# Patient Record
Sex: Male | Born: 1965 | Race: White | Marital: Married | Smoking: Former smoker
Health system: Northeastern US, Academic
[De-identification: ages and names within clinical notes are randomized; demographics above are authoritative.]

## PROBLEM LIST (undated history)

## (undated) DIAGNOSIS — H04129 Dry eye syndrome of unspecified lacrimal gland: Secondary | ICD-10-CM

## (undated) DIAGNOSIS — I10 Essential (primary) hypertension: Secondary | ICD-10-CM

## (undated) DIAGNOSIS — F988 Other specified behavioral and emotional disorders with onset usually occurring in childhood and adolescence: Secondary | ICD-10-CM

## (undated) DIAGNOSIS — F32A Depression, unspecified: Secondary | ICD-10-CM

## (undated) DIAGNOSIS — K635 Polyp of colon: Secondary | ICD-10-CM

## (undated) DIAGNOSIS — F419 Anxiety disorder, unspecified: Secondary | ICD-10-CM

## (undated) DIAGNOSIS — G4730 Sleep apnea, unspecified: Secondary | ICD-10-CM

## (undated) HISTORY — DX: Dry eye syndrome of unspecified lacrimal gland: H04.129

## (undated) HISTORY — DX: Essential (primary) hypertension: I10

## (undated) HISTORY — DX: Other specified behavioral and emotional disorders with onset usually occurring in childhood and adolescence: F98.8

## (undated) HISTORY — DX: Anxiety disorder, unspecified: F41.9

---

## 2011-07-18 ENCOUNTER — Encounter: Payer: Self-pay | Admitting: Gastroenterology

## 2011-07-18 ENCOUNTER — Ambulatory Visit: Payer: Self-pay | Admitting: Primary Care

## 2011-07-18 LAB — LDL CHOLESTEROL, DIRECT: LDL Calculated: 126

## 2011-07-21 ENCOUNTER — Encounter: Payer: Self-pay | Admitting: Primary Care

## 2011-07-21 ENCOUNTER — Ambulatory Visit: Payer: Self-pay | Admitting: Primary Care

## 2011-07-21 VITALS — BP 140/90 | HR 64 | Ht 67.5 in | Wt 232.0 lb

## 2011-07-21 DIAGNOSIS — F32A Depression, unspecified: Secondary | ICD-10-CM | POA: Insufficient documentation

## 2011-07-21 DIAGNOSIS — F419 Anxiety disorder, unspecified: Secondary | ICD-10-CM

## 2011-07-21 MED ORDER — ESCITALOPRAM OXALATE 10 MG PO TABS *I*
10.0000 mg | ORAL_TABLET | Freq: Every day | ORAL | Status: DC
Start: 2011-07-21 — End: 2011-08-08

## 2011-07-21 NOTE — Progress Notes (Signed)
Patient presents today for...  Mood-probably been an issue for years, varying levels. Currently out of work, couple of weeks. So that hasnt' helped. Work, city [employer].  He chose to leave work.  Taken out of work on disability.  Work has been a somewhat toxic environment. Also got married in March, blended family, together for 7. Two kids, live with mom. He's been separated for 10 years, that is OK. She's got 18, 14, 12. Wife reports that he's not doing well with anxiety. Particularly when he was at work.  Some improvement after taking his leave, but still gets worked up when he thinks about returning on the 20th. Next year there will be a different position for him, this year, he wants to go back, feels some obligation to the graduation ceremony, etc, but then again really doesn't want to have to work with this boss ever again.  Wife notices when anxious, will get a temper, short fuse,.  Also some symptoms of depression, lack of motivation, withdrawal, lack of interest in sex, some resignation.   He's actually been treated for anxiety for many years, thinks he's been on medications for 18 years, started on Paxil, then wellbutrin added in 5-6 years. No major adjustments since then.     Some shortness of breath and palpitations. Does seem to be with situational stuff.     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 His two teenagers live with mom      MEDICAL PROBLEMS  Patient Active Problem List   Diagnosis Code   . Anxiety 300.00   . Depression 311       CURRENT ALLERGIES  Review of patient's allergies indicates no known allergies (drug, envir, food or latex).    CURRENT MEDICATIONS  Current Outpatient Prescriptions   Medication Sig   . PARoxetine (PAXIL) 20 MG tablet Take 20 mg by mouth every morning   . buPROPion (WELLBUTRIN XL) 300 MG 24 hr tablet Take 300 mg by mouth every morning   Swallow whole. Do  not crush, break, or chew.   . Multiple Vitamin (MULTIVITAMIN) per tablet Take 1 tablet by mouth daily   . aspirin 81 MG tablet Take 81 mg by mouth daily   . omega-3 acid ethyl esters (LOVAZA) 1 GM capsule Take 2 g by mouth 2 times daily   Swallow whole. Do not crush or chew.     No current facility-administered medications for this visit.       The patients medications and allergies were reviewed, reconciled as needed and no changes were made.    EXAMINATION  Filed Vitals:    07/21/11 1503   BP: 140/90   Pulse: 64   Height: 1.715 m (5' 7.5")   Weight: 105.235 kg (232 lb)     Body mass index is 35.78 kg/(m^2).  Last 4 Weights    07/21/11 1503   Weight: 105.235 kg (232 lb)             IMPRESSION:  Anxiety-with some depression.  With the rage response, not sure the Wellbutrin is the best choice, but since the anxiety is the main issue now, we decided to first make a change in the SSRI to see if we could improve that. Since he's been on meds for so long, don't really want to make a change in too many things at once. He's going to a  marriage counselor starting on Fri, did review that might want to consider individual counselor, he's done that off and on but sounds like there hasn't been an on going counselor for him. We could increase the paxil, but he's not sure that either of these meds have done anything for him, so we decided to make a change. Stop paxil, start lexapro 10mg , have him back in 2-3 weeks to review effect. If we start to get some improvement, then consider backing off on the Wellbutrin.  Might consider psych referral in the future if we're not having luck. Re decreased libido, he just had blood work done, normal except for chol, so we'll wait and see if that changes as mood improves, if not, consider testosterone level check.    Chol-numbers are a little high, mostly with TG. Focus would be weight reduction, defer on medication for now, we'll review that again in the future.     TT with pt and wife 50  min, all counseling regarding diagnosis, management options, medications etc.

## 2011-07-23 ENCOUNTER — Encounter: Payer: Self-pay | Admitting: Primary Care

## 2011-08-08 ENCOUNTER — Encounter: Payer: Self-pay | Admitting: Primary Care

## 2011-08-08 ENCOUNTER — Ambulatory Visit: Payer: Self-pay | Admitting: Primary Care

## 2011-08-08 VITALS — BP 118/70 | HR 62 | Ht 68.0 in | Wt 234.0 lb

## 2011-08-08 MED ORDER — ESCITALOPRAM OXALATE 20 MG PO TABS *I*
20.0000 mg | ORAL_TABLET | Freq: Every day | ORAL | Status: DC
Start: 2011-08-08 — End: 2012-02-06

## 2011-08-08 NOTE — Patient Instructions (Signed)
My vote-increase the lexapro to 20mg , and stop the wellbutrin.

## 2011-08-08 NOTE — Progress Notes (Signed)
Patient presents today for...  Got some body pain after a week to ten days, sore like you first got back to the gym, that cleared after a couple of days. In general, better, but also been off work still. Longer stretches of no blow ups, but patience still short and over reaction. Kicks bikes, etc. Good days are better.  Moody still. Gone to a session with the counselor. Did find it helpful. Making efforts to change districts.     Is noticing some sexual side effects, although they were present on the paxil as well. He's not sure, but might be more a lack of interest/libido thing over an erectile function issue, but there is a component of the later too.     MEDICAL PROBLEMS  Patient Active Problem List   Diagnosis Code   . Anxiety 300.00   . Depression 311       CURRENT ALLERGIES  Review of patient's allergies indicates no known allergies (drug, envir, food or latex).    CURRENT MEDICATIONS  Current Outpatient Prescriptions   Medication Sig   . buPROPion (WELLBUTRIN XL) 300 MG 24 hr tablet Take 300 mg by mouth every morning   Swallow whole. Do not crush, break, or chew.   . Multiple Vitamin (MULTIVITAMIN) per tablet Take 1 tablet by mouth daily   . aspirin 81 MG tablet Take 81 mg by mouth daily   . omega-3 acid ethyl esters (LOVAZA) 1 GM capsule Take 2 g by mouth 2 times daily   Swallow whole. Do not crush or chew.   . escitalopram (LEXAPRO) 10 MG tablet Take 1 tablet (10 mg total) by mouth daily     No current facility-administered medications for this visit.       The patients medications and allergies were reviewed, reconciled as needed and no changes were made.    EXAMINATION  Filed Vitals:    08/08/11 0915   BP: 118/70   Pulse: 62   Height: 1.727 m (5\' 8" )   Weight: 106.142 kg (234 lb)     Body mass index is 35.59 kg/(m^2).  Last 4 Weights    08/08/11 0915   Weight: 106.142 kg (234 lb)             IMPRESSION/PLAN   Anxiety-with some depression, work is a major stressor for him, but now getting into the issue  with too much time out and he's starting to wonder about worth/productivity issues. However, just considering going back seems to panic him still. We have a natural stop to that coming up in July, since he'll start a new position. If we have to, we can wait to send him back until then.  With his rage, still wondering in the wellbutrin might be causing that. I'd like him to stop that and increase the lexapro to 20mg .  We talked about the sexual side effects and if that worsens we can consider a PDI med for a bit. If he improves, he can let me know and we can try him back at work in June, but note written to keep him out until July if we still have lingering issues.   TT 30 min discussion with pt and wife.

## 2011-09-05 ENCOUNTER — Telehealth: Payer: Self-pay | Admitting: Primary Care

## 2011-09-05 NOTE — Telephone Encounter (Signed)
Letter written

## 2011-09-05 NOTE — Telephone Encounter (Signed)
Needs a note to return to work, Will be going back on 09/11/11.    Please put ATTN:  [facility]    [phone] Pt's wife fax #

## 2011-09-05 NOTE — Telephone Encounter (Signed)
Letter faxed.

## 2011-09-08 ENCOUNTER — Telehealth: Payer: Self-pay | Admitting: Primary Care

## 2011-09-08 NOTE — Telephone Encounter (Signed)
Letter faxed.

## 2011-09-08 NOTE — Telephone Encounter (Signed)
I did this, it may have been sent already, but I'll reprint it.

## 2011-09-08 NOTE — Telephone Encounter (Signed)
Patient need [employer] letter stating its ok for him to return to work on 09-11-11 and the fax to [phone]

## 2011-09-17 ENCOUNTER — Ambulatory Visit: Payer: Self-pay | Admitting: Physician Assistant

## 2011-09-17 ENCOUNTER — Encounter: Payer: Self-pay | Admitting: Physician Assistant

## 2011-09-17 VITALS — BP 136/90 | HR 76 | Temp 98.5°F | Ht 67.25 in | Wt 233.0 lb

## 2011-09-17 DIAGNOSIS — R070 Pain in throat: Secondary | ICD-10-CM

## 2011-09-17 DIAGNOSIS — J069 Acute upper respiratory infection, unspecified: Secondary | ICD-10-CM

## 2011-09-17 MED ORDER — FLUTICASONE PROPIONATE 50 MCG/ACT NA SUSP *I*
2.0000 | Freq: Every day | NASAL | Status: DC
Start: 2011-09-17 — End: 2012-05-18

## 2011-09-17 MED ORDER — ALBUTEROL SULFATE HFA 108 (90 BASE) MCG/ACT IN AERS *I*
1.0000 | INHALATION_SPRAY | Freq: Four times a day (QID) | RESPIRATORY_TRACT | Status: DC | PRN
Start: 2011-09-17 — End: 2012-05-18

## 2011-09-17 MED ORDER — CEFDINIR 300 MG PO CAPS *I*
300.0000 mg | ORAL_CAPSULE | Freq: Two times a day (BID) | ORAL | Status: AC
Start: 2011-09-17 — End: 2011-09-27

## 2011-09-17 NOTE — Progress Notes (Signed)
Patient is a 46 year old male reports yachts today with chief complaint of upper respiratory symptoms for 4 days.  I reports of cough, scratchy throat, postnasal drip.  Denies any fevers or chills.  He has been using Advil and over-the-counter cold medication.  He does smoke cigars 1-2 a day    Patient sitting in chair in no acute distress  HEEN,Nose: turbinates clear no discharge, Tongue: midline, tonsills no exudate, nonerythematous, Ears: the external canal clear tempanic membrane visible noninjected bilaterally  Face: Nontender frontal or maxillary sinuses  Lymphatics no lymphadenopathy in anterior or posterior cervical chain, submental or submandibular region, pre-or post auricular region  Lungs: Clear to auscultation no wheezes rhonchi or rails.  Cardiac: Regular rate and rhythm no murmurs gallops or rubs.      Assessment and plan:  #1 URI: Patient may have viral upper respiratory earlier bronchitis.  His lungs are clear auscultation of ecchymosis symptoms are due to postnasal drip I advised Flonase and Claritin.  For his cough I gave him albuterol.  I did call her prescription for omnicef advised him to fill this in 2 days if symptoms have not resolved  #2 throat pain rapid strep was negative throat culture was sent

## 2011-09-17 NOTE — Patient Instructions (Signed)
claritin 10 mg once a day for 1 week

## 2011-09-19 LAB — STREP A CULTURE, THROAT

## 2011-09-22 ENCOUNTER — Telehealth: Payer: Self-pay | Admitting: Physician Assistant

## 2011-09-22 ENCOUNTER — Telehealth: Payer: Self-pay | Admitting: Primary Care

## 2011-09-22 NOTE — Telephone Encounter (Signed)
Message copied by Duane Lope on Mon Sep 22, 2011 10:25 AM  ------       Message from: Christene Lye       Created: Mon Sep 22, 2011  9:25 AM         Please call pt and inform him he has strep and to start the omnicef i gave him  ------

## 2011-09-22 NOTE — Telephone Encounter (Signed)
Attempted all, at [phone], unable to reach patient, work number we need an extension, all a recording,  "other" number not working and home # is incorrect.

## 2011-09-22 NOTE — Telephone Encounter (Addendum)
Attempted all, at [phone], unable to reach patient, work number we need an extension, all a recording, "other" number not working and home # is incorrect.          Duane Lope Nicki [place]) A at 09/22/2011 10:25 AM     Status: Signed                Message copied by Duane Lope on Mon Sep 22, 2011 10:25 AM   ------   Message from: Christene Lye   Created: Mon Sep 22, 2011 9:25 AM   Please call pt and inform him he has strep and to start the [facility] i gave him   ------              Per above note  I called patient's work number I spoke with Diplomatic Services operational officer in [employer]. I asked her to please contact patient and have him call Dr.Vaules office  I did not give her any other information  I discussed with Dr. Alinda Sierras, we were able to contact his wife who is on his [employer] form.  She stated his cell phone number is [phone]        Patient did call back later in the day I did inform him to start antibiotic

## 2011-09-23 NOTE — Telephone Encounter (Signed)
Spoke with patient wife, patient was contacted yesterday by joe. Patient aware.Marland KitchenMarland Kitchen

## 2011-12-22 ENCOUNTER — Ambulatory Visit: Payer: Self-pay | Admitting: Primary Care

## 2011-12-22 ENCOUNTER — Encounter: Payer: Self-pay | Admitting: Primary Care

## 2011-12-22 VITALS — BP 120/84 | HR 76 | Ht 67.0 in | Wt 238.0 lb

## 2011-12-22 DIAGNOSIS — R41840 Attention and concentration deficit: Secondary | ICD-10-CM

## 2011-12-22 DIAGNOSIS — F419 Anxiety disorder, unspecified: Secondary | ICD-10-CM

## 2011-12-22 MED ORDER — ATOMOXETINE HCL 40 MG PO CAPS *I*
40.0000 mg | ORAL_CAPSULE | Freq: Two times a day (BID) | ORAL | Status: DC
Start: 2011-12-22 — End: 2012-04-01

## 2011-12-22 NOTE — Patient Instructions (Signed)
What we will try is adding in a different attention med called straterra. That way we can leave the lexapro where it is and see if we can address the new attention/fidgety symptom with the new drug. If that doesn't work, then we'll probably have to drop the lexapro and try something else. The new medicine will be taken without changing the lexapro dose. Start with one pill a day, then after 3 days go to twice a day. Hopefully, will notice some effect in around a week, but can take up to 4 weeks to reach a full effect.

## 2011-12-22 NOTE — Progress Notes (Signed)
Patient presents today for...  Anxiety-we switched him off Paxil and Wellbutrin and start him on Lexapro.  He noticed his generalized anxiety seems to be much less.  Overall feels better.  Next he feels is a slight difference in for now.  Feels a little but more antsy at times and having some difficulty with focus.  Difficulty concentrating on tasks at work sometimes.  Also feels that he needs to get certain locations when he's driving in a car when he was in a concert felt the need to get away from people.  Its a new symptom for him he does recall having a component of his anxiety before.  Actually had a friend who had some medication for attention deficit and he did try focalin 40mg , helped some, but not totally, still had the irritability and the feeling of being fidgety.  Only a think she's noted since starting the Lexapro is that his weight is up.  He doesn't recall having any issues with attention in his youth.    MEDICAL PROBLEMS  Patient Active Problem List   Diagnosis Code   . Anxiety 300.00   . Depression 311       CURRENT ALLERGIES  Review of patient's allergies indicates no known allergies (drug, envir, food or latex).    CURRENT MEDICATIONS  Current Outpatient Prescriptions   Medication Sig   . escitalopram (LEXAPRO) 20 MG tablet Take 1 tablet (20 mg total) by mouth daily   . Multiple Vitamin (MULTIVITAMIN) per tablet Take 1 tablet by mouth daily   . aspirin 81 MG tablet Take 81 mg by mouth daily   . omega-3 acid ethyl esters (LOVAZA) 1 GM capsule Take 2 g by mouth 2 times daily   Swallow whole. Do not crush or chew.   . fluticasone (FLONASE) 50 MCG/ACT nasal spray 2 sprays by Nasal route daily   . albuterol (PROVENTIL, VENTOLIN, PROAIR HFA) 108 (90 BASE) MCG/ACT inhaler Inhale 1-2 puffs into the lungs every 6 hours as needed for Wheezing   Shake well before each use.     No current facility-administered medications for this visit.       The patients medications and allergies were reviewed, reconciled  as needed and no changes were made.    EXAMINATION  Filed Vitals:    12/22/11 1600   BP: 120/84   Pulse: 76   Height: 1.702 m (5\' 7" )   Weight: 107.956 kg (238 lb)     Body mass index is 37.27 kg/(m^2).  Last 4 Weights    12/22/11 1600   Weight: 107.956 kg (238 lb)             IMPRESSION/PLAN   Anxiety-current symptom is kind of a mix of an attention deficit almost a mild hyperactivity type symptoms.  A little unusual in adults.  May just be a component of treating his anxiety with the newer medication and a dressing different symptoms.  It's possible this since he was there before and was treated by the Wellbutrin Paxil and unmasked that.  We discussed the options today which would be changing medication yet again to a different pill for the anxiety or perhaps trying a different form of a stimulant since he failed the Focalin.  We elected to try the latter first and started on Strattera 40 mg daily for 3 days and increasing to twice a day.  We'll see how much that helps with focus attention in many tendency for the hyperactivity.  He doesn't cause  full resolution of symptoms might have to consider stopping the Lexapro and starting a new agent.    Total time spent with patient 25 minutes all counseling

## 2011-12-31 ENCOUNTER — Telehealth: Payer: Self-pay | Admitting: Primary Care

## 2011-12-31 NOTE — Telephone Encounter (Signed)
The medication prescribe on his office visit 12/22/11 Strattera 40 mg take one capsule by mouth twice a day requires a Prior Authorization, I called [facility] per patient insurance the only way they will cover Strattera if he has tried two long acting generic drugs. Which would be medications like  Generics:  Concerta ER, Adderall XR, Ritalin LA. Focalin will not be covered.       Spoke with patient wife, she is aware of this info she is ok with waiting until dr returns: do not fell comfortable with another provider changing her husband medication.

## 2012-01-02 MED ORDER — METHYLPHENIDATE HCL 40 MG PO CP24 *A*
40.0000 mg | ORAL_CAPSULE | Freq: Every day | ORAL | Status: DC
Start: 2012-01-02 — End: 2012-01-22

## 2012-01-02 NOTE — Telephone Encounter (Signed)
Spoke with patient spouse..... She is aware.

## 2012-01-02 NOTE — Telephone Encounter (Signed)
We'll try the ritalin LA at 40mg , script printed.

## 2012-01-22 ENCOUNTER — Other Ambulatory Visit: Payer: Self-pay | Admitting: Primary Care

## 2012-01-22 MED ORDER — METHYLPHENIDATE HCL 40 MG PO CP24 *A*
40.0000 mg | ORAL_CAPSULE | Freq: Every day | ORAL | Status: DC
Start: 2012-01-22 — End: 2012-02-27

## 2012-02-06 ENCOUNTER — Other Ambulatory Visit: Payer: Self-pay | Admitting: Primary Care

## 2012-02-17 ENCOUNTER — Other Ambulatory Visit: Payer: Self-pay | Admitting: Primary Care

## 2012-02-27 ENCOUNTER — Other Ambulatory Visit: Payer: Self-pay | Admitting: Primary Care

## 2012-02-27 MED ORDER — METHYLPHENIDATE HCL 40 MG PO CP24 *A*
40.0000 mg | ORAL_CAPSULE | Freq: Every day | ORAL | Status: DC
Start: 2012-02-27 — End: 2012-04-01

## 2012-02-27 NOTE — Telephone Encounter (Signed)
rx up front for pick up 

## 2012-02-27 NOTE — Telephone Encounter (Signed)
I stop checked and documented

## 2012-04-01 ENCOUNTER — Ambulatory Visit: Payer: Self-pay | Admitting: Primary Care

## 2012-04-01 ENCOUNTER — Encounter: Payer: Self-pay | Admitting: Primary Care

## 2012-04-01 VITALS — BP 120/80 | HR 64 | Ht 68.0 in | Wt 239.0 lb

## 2012-04-01 DIAGNOSIS — R42 Dizziness and giddiness: Secondary | ICD-10-CM

## 2012-04-01 MED ORDER — METHYLPHENIDATE HCL 40 MG PO CP24 *A*
40.0000 mg | ORAL_CAPSULE | Freq: Every day | ORAL | Status: DC
Start: 2012-04-01 — End: 2012-04-01

## 2012-04-01 MED ORDER — ESCITALOPRAM OXALATE 20 MG PO TABS *I*
20.0000 mg | ORAL_TABLET | Freq: Every day | ORAL | Status: DC
Start: 2012-04-01 — End: 2015-11-07

## 2012-04-01 MED ORDER — MECLIZINE HCL 25 MG PO TABS *I*
25.0000 mg | ORAL_TABLET | Freq: Three times a day (TID) | ORAL | Status: DC | PRN
Start: 2012-04-01 — End: 2012-05-18

## 2012-04-01 MED ORDER — METHYLPHENIDATE HCL 40 MG PO CP24 *A*
ORAL_CAPSULE | ORAL | Status: DC
Start: 2012-04-01 — End: 2012-04-05

## 2012-04-01 NOTE — Progress Notes (Signed)
Patient presents today for...  Dizziness-past week or so, having both a spinning feeling and a lightheaded feeling, a feeling of fogginess. There off and on, at its peak, would need to go lay down. Was trying some dramamine, helped a little. Feels unsteady after getting up off the bed. Laying on the couch, noticed a feeling like he's falling. Maybe a little queasy, but not much. No fevers, no chills, a little stuffy. Seems worse during the cold snap, felt better when the temperature went up to 40.  Had something like this before, but its been years.      ADD-seems to be doing better, that change happened months ago.     MEDICAL PROBLEMS  Patient Active Problem List   Diagnosis Code   . Anxiety 300.00   . Depression 311       CURRENT ALLERGIES  Review of patient's allergies indicates no known allergies (drug, envir, food or latex).    CURRENT MEDICATIONS  Current Outpatient Prescriptions   Medication Sig   . methylphenidate (RITALIN LA) 40 MG 24 hr capsule Take 1 capsule (40 mg total) by mouth daily (before breakfast)   MDD 40 mg   . escitalopram (LEXAPRO) 20 MG tablet TAKE 1 TABLET BY MOUTH EVERY DAY   . atomoxetine (STRATTERA) 40 MG capsule Take 1 capsule (40 mg total) by mouth 2 times daily   . fluticasone (FLONASE) 50 MCG/ACT nasal spray 2 sprays by Nasal route daily   . albuterol (PROVENTIL, VENTOLIN, PROAIR HFA) 108 (90 BASE) MCG/ACT inhaler Inhale 1-2 puffs into the lungs every 6 hours as needed for Wheezing   Shake well before each use.   . Multiple Vitamin (MULTIVITAMIN) per tablet Take 1 tablet by mouth daily   . aspirin 81 MG tablet Take 81 mg by mouth daily   . omega-3 acid ethyl esters (LOVAZA) 1 GM capsule Take 2 g by mouth 2 times daily   Swallow whole. Do not crush or chew.     No current facility-administered medications for this visit.       The patients medications and allergies were reviewed, reconciled as needed and no changes were made.    EXAMINATION  Filed Vitals:    04/01/12 0818   BP: 120/80    Pulse: 64   Height: 1.727 m (5\' 8" )   Weight: 108.41 kg (239 lb)     Body mass index is 36.35 kg/(m^2).  Last 4 Weights    04/01/12 0818   Weight: 108.41 kg (239 lb)     Carotids are without bruits  Thyroid is nl, no nodules  Ears are clear. EOMI, facial muscles move symmetrically, finger to nose done normally. Rhomberg negative, gait and speech are nl.   Lungs are clear, no wheeze, no rales  Card exam shows a rrr, nl S1, S2, no murmors  Ext show no edema. Good DP pulses.  Hallpike maneuver back to the left reproduces dizziness, but no nystagmus.     IMPRESSION/PLAN   Dizziness-seems consistent with peripheral source, no findings to implicate central process. We'll try him on some meclizine and give this some time.     ADD-changes script to three month supply.

## 2012-04-05 ENCOUNTER — Telehealth: Payer: Self-pay | Admitting: Primary Care

## 2012-04-05 MED ORDER — METHYLPHENIDATE HCL 40 MG PO CP24 *A*
40.0000 mg | ORAL_CAPSULE | Freq: Every day | ORAL | Status: DC
Start: 2012-04-05 — End: 2012-05-13

## 2012-04-05 NOTE — Telephone Encounter (Signed)
rx mailed to pharmacy

## 2012-04-05 NOTE — Telephone Encounter (Signed)
pharmacist called back with request from the pt to have the rx mailed to the pharmacy when it is complete.  [facility]

## 2012-04-05 NOTE — Telephone Encounter (Signed)
Received rx for methylphenidate with no directions.  Please  Reprint with directions & pt will come back to pick up.

## 2012-05-13 ENCOUNTER — Other Ambulatory Visit: Payer: Self-pay | Admitting: Primary Care

## 2012-05-13 MED ORDER — METHYLPHENIDATE HCL 40 MG PO CP24 *A*
40.0000 mg | ORAL_CAPSULE | Freq: Every day | ORAL | Status: DC
Start: 2012-05-13 — End: 2012-07-14

## 2012-05-13 NOTE — Telephone Encounter (Signed)
I stop checked and documented

## 2012-05-14 NOTE — Telephone Encounter (Signed)
rx up front for pick up 

## 2012-05-18 ENCOUNTER — Encounter: Payer: Self-pay | Admitting: Primary Care

## 2012-05-18 ENCOUNTER — Ambulatory Visit: Payer: Self-pay | Admitting: Primary Care

## 2012-05-18 VITALS — BP 120/92 | HR 74 | Temp 98.8°F | Ht 68.0 in | Wt 237.0 lb

## 2012-05-18 DIAGNOSIS — J40 Bronchitis, not specified as acute or chronic: Secondary | ICD-10-CM

## 2012-05-18 MED ORDER — ALBUTEROL SULFATE HFA 108 (90 BASE) MCG/ACT IN AERS *I*
1.0000 | INHALATION_SPRAY | Freq: Four times a day (QID) | RESPIRATORY_TRACT | Status: DC | PRN
Start: 2012-05-18 — End: 2014-01-04

## 2012-05-18 NOTE — Progress Notes (Signed)
Patient presents today for...  Bronchitis-started with his wife, she had an URI not too long ago, he typically will get things from her, there was a week in between, but the last 4-5 days, started to get that. Coughing a lot, was wheezing last night, couldn't lay flat. Malaise, stuffy nose, no fever. Cough is dry. Took a couple of nyquil last night, not sure if it helped. No pain with breathing.     Cutting back on the cigars, trying to remove triggers, drinking lots of tea. Maybe smoking 1 every 2-2.5 days.     MEDICAL PROBLEMS  Patient Active Problem List   Diagnosis Code   . Anxiety 300.00   . Depression 311       CURRENT ALLERGIES  Review of patient's allergies indicates no known allergies (drug, envir, food or latex).    CURRENT MEDICATIONS  Current Outpatient Prescriptions   Medication Sig   . methylphenidate (RITALIN LA) 40 MG 24 hr capsule Take 1 capsule (40 mg total) by mouth daily (before breakfast)   MDD 40 mg  Code B   . escitalopram (LEXAPRO) 20 MG tablet Take 1 tablet (20 mg total) by mouth daily   . Multiple Vitamin (MULTIVITAMIN) per tablet Take 1 tablet by mouth daily   . aspirin 81 MG tablet Take 81 mg by mouth daily   . omega-3 acid ethyl esters (LOVAZA) 1 GM capsule Take 2 g by mouth 2 times daily   Swallow whole. Do not crush or chew.     No current facility-administered medications for this visit.       The patients medications and allergies were reviewed, reconciled as needed and no changes were made.    EXAMINATION  Filed Vitals:    05/18/12 1101   BP: 120/92   Pulse: 74   Temp: 37.1 C (98.8 F)   TempSrc: Tympanic   Height: 1.727 m (5\' 8" )   Weight: 107.502 kg (237 lb)   SpO2: 99%     Body mass index is 36.04 kg/(m^2).  Last 4 Weights    05/18/12 1101   Weight: 107.502 kg (237 lb)     Carotids are without bruits  Thyroid is nl, no nodules  Lungs are clear, no wheeze, no rales, he might have a very slight wheeze with forced exhalation.   Card exam shows a rrr, nl S1, S2, no murmors  Ext show  no edema. Good DP pulses.    IMPRESSION/PLAN   Bronchitis-reviewed that most of these are viral, we'll treat the wheeze with some albuterol, instructed on use and reviewed possible side effects.  If things persist beyond the two weeks or if he gets pleuritic pain, fever or SOB worsening, he will call.

## 2012-07-14 ENCOUNTER — Other Ambulatory Visit: Payer: Self-pay | Admitting: Primary Care

## 2012-07-14 NOTE — Telephone Encounter (Signed)
istop checked

## 2012-07-15 MED ORDER — METHYLPHENIDATE HCL 40 MG PO CP24 *A*
40.0000 mg | ORAL_CAPSULE | Freq: Every day | ORAL | Status: DC
Start: 2012-07-14 — End: 2012-10-07

## 2012-07-16 ENCOUNTER — Ambulatory Visit: Payer: Self-pay | Admitting: Primary Care

## 2012-07-16 ENCOUNTER — Encounter: Payer: Self-pay | Admitting: Primary Care

## 2012-07-16 VITALS — BP 120/80 | HR 66 | Ht 68.0 in | Wt 236.0 lb

## 2012-07-16 DIAGNOSIS — L02211 Cutaneous abscess of abdominal wall: Secondary | ICD-10-CM

## 2012-07-16 MED ORDER — CEPHALEXIN 500 MG PO CAPS *I*
500.0000 mg | ORAL_CAPSULE | Freq: Three times a day (TID) | ORAL | Status: DC
Start: 2012-07-16 — End: 2013-05-04

## 2012-07-16 NOTE — Progress Notes (Signed)
Patient presents today for...  Rash-noticed it on Tue, 4 days ago. He was at work, felt some irritation in the groin, later in the day, went to the bathroom and noticed a rash.  Seemed to get worse with some manipulation. He mentioned to his wife, she had a yeast infection and she treated that and is fine.   Rash is a swelling on one side, and on the side of the leg a little sensitive.     MEDICAL PROBLEMS  Patient Active Problem List   Diagnosis Code   . Anxiety 300.00   . Depression 311       CURRENT ALLERGIES  Review of patient's allergies indicates no known allergies (drug, envir, food or latex).    CURRENT MEDICATIONS  Current Outpatient Prescriptions   Medication Sig   . methylphenidate (RITALIN LA) 40 MG 24 hr capsule Take 1 capsule (40 mg total) by mouth daily (before breakfast)   MDD 40 mg  Code B   . albuterol (PROVENTIL, VENTOLIN, PROAIR HFA) 108 (90 BASE) MCG/ACT inhaler Inhale 1-2 puffs into the lungs every 6 hours as needed for Wheezing   Shake well before each use.   . escitalopram (LEXAPRO) 20 MG tablet Take 1 tablet (20 mg total) by mouth daily   . Multiple Vitamin (MULTIVITAMIN) per tablet Take 1 tablet by mouth daily   . aspirin 81 MG tablet Take 81 mg by mouth daily   . omega-3 acid ethyl esters (LOVAZA) 1 GM capsule Take 2 g by mouth 2 times daily   Swallow whole. Do not crush or chew.     No current facility-administered medications for this visit.       The patients medications and allergies were reviewed, reconciled as needed and no changes were made.    EXAMINATION  Filed Vitals:    07/16/12 1541   BP: 120/80   Pulse: 66   Height: 1.727 m (5\' 8" )   Weight: 107.049 kg (236 lb)     Body mass index is 35.89 kg/(m^2).  Last 4 Weights    07/16/12 1541   Weight: 107.049 kg (236 lb)     Really not a diffuse rash, more of a cyst, seems a bit higher than the LN. The raised area is around 0.5cm, pretty firm, not fluctuant. It is tender to touch. There is a scab on the surface but he states that is  from where he scratched it.         IMPRESSION/PLAN   Cyst-seems more consistent with a sebaceous cyst than a rash or yeast process. We not reddened, but it tender. We will start him on some antibiotics, if it doesn't. Improve-might ask derm to take a look. I'd doubt a malignancy with how quickly it arose, but I can't get a definite explanation for this.  Not really a pustule like a herpetic lesion, not ulcerating like some of the STDs would in that area, not varigated or warty, surface smooth and he denies any risky behavior or new partners.

## 2012-08-13 ENCOUNTER — Telehealth: Payer: Self-pay | Admitting: Primary Care

## 2012-08-13 ENCOUNTER — Ambulatory Visit: Payer: Self-pay | Admitting: Primary Care

## 2012-08-13 NOTE — Telephone Encounter (Signed)
Pt had an apt. This afternoon , he canceled because he could not get out of work will call back on Monday if he is still not feeling well.

## 2012-10-07 ENCOUNTER — Other Ambulatory Visit: Payer: Self-pay | Admitting: Primary Care

## 2012-10-07 MED ORDER — METHYLPHENIDATE HCL 40 MG PO CP24 *A*
40.0000 mg | ORAL_CAPSULE | Freq: Every day | ORAL | Status: DC
Start: 2012-10-07 — End: 2013-01-10

## 2012-10-07 NOTE — Telephone Encounter (Signed)
Wife advised prescription be ready for pick up on Monday.

## 2012-10-07 NOTE — Telephone Encounter (Signed)
Wife advised. 

## 2012-10-08 NOTE — Telephone Encounter (Signed)
Prescription up front.

## 2013-01-10 ENCOUNTER — Other Ambulatory Visit: Payer: Self-pay | Admitting: Primary Care

## 2013-01-10 MED ORDER — METHYLPHENIDATE HCL 40 MG PO CP24 *A*
40.0000 mg | ORAL_CAPSULE | Freq: Every day | ORAL | Status: DC
Start: 2013-01-10 — End: 2013-04-05

## 2013-01-10 NOTE — Telephone Encounter (Signed)
Istop checked

## 2013-01-10 NOTE — Telephone Encounter (Signed)
RX dated 01/10/2013 upfront for pickup

## 2013-01-28 ENCOUNTER — Encounter: Payer: Self-pay | Admitting: Gastroenterology

## 2013-02-14 ENCOUNTER — Other Ambulatory Visit: Payer: Self-pay | Admitting: Primary Care

## 2013-03-07 ENCOUNTER — Ambulatory Visit: Payer: Self-pay | Admitting: Primary Care

## 2013-04-05 ENCOUNTER — Other Ambulatory Visit: Payer: Self-pay | Admitting: Primary Care

## 2013-04-05 ENCOUNTER — Ambulatory Visit: Payer: Self-pay | Admitting: Primary Care

## 2013-04-05 MED ORDER — METHYLPHENIDATE HCL 40 MG PO CP24 *A*
40.0000 mg | ORAL_CAPSULE | Freq: Every day | ORAL | Status: DC
Start: 2013-04-05 — End: 2013-07-12

## 2013-04-05 NOTE — Telephone Encounter (Signed)
istop checked

## 2013-04-05 NOTE — Telephone Encounter (Signed)
Script dated April 05, 2013 up front for pick up.

## 2013-05-02 ENCOUNTER — Ambulatory Visit: Payer: Self-pay | Admitting: Primary Care

## 2013-05-04 ENCOUNTER — Encounter: Payer: Self-pay | Admitting: Primary Care

## 2013-05-04 ENCOUNTER — Ambulatory Visit: Payer: Self-pay | Admitting: Primary Care

## 2013-05-04 VITALS — BP 130/85 | HR 68 | Ht 68.0 in | Wt 241.0 lb

## 2013-05-04 DIAGNOSIS — F988 Other specified behavioral and emotional disorders with onset usually occurring in childhood and adolescence: Secondary | ICD-10-CM | POA: Insufficient documentation

## 2013-05-04 NOTE — Progress Notes (Signed)
Patient presents today for...  Weight-weight was up to 250, hadn't been going consistently to the gym, carried over from the summer, more recently getting there 3-4 days a week. He is wondering if the med (lexapro) is related.  Breakfast and lunch tend to be healthy, dinner with the family, for him he thinks its portion control. Working on that.     Anxiety-he feels he's doing well and worth taking the med.    ADD-he's not as sure with the ritalin, attention has been better.     Smoking-quit for 6 weeks, in the fall, noticed some weight gain, so he attributed that to quitting, so got back on.  Not heavy, would like to stop.  Successful with the gum.     ROS   Dt 17. Son 15.     MEDICAL PROBLEMS  Patient Active Problem List   Diagnosis Code    Anxiety 300.00    Depression 311    ADD (attention deficit disorder) 314.00       CURRENT ALLERGIES  Review of patient's allergies indicates no known allergies (drug, envir, food or latex).    CURRENT MEDICATIONS  Current Outpatient Prescriptions   Medication Sig    methylphenidate (RITALIN LA) 40 MG 24 hr capsule Take 1 capsule (40 mg total) by mouth daily (before breakfast)   Max daily dose: 40 mg MDD 40 mg  Code B    escitalopram (LEXAPRO) 20 MG tablet Take 1 tablet (20 mg total) by mouth daily    Multiple Vitamin (MULTIVITAMIN) per tablet Take 1 tablet by mouth daily    albuterol (PROVENTIL, VENTOLIN, PROAIR HFA) 108 (90 BASE) MCG/ACT inhaler Inhale 1-2 puffs into the lungs every 6 hours as needed for Wheezing   Shake well before each use.     No current facility-administered medications for this visit.       The patients medications and allergies were reviewed, reconciled as needed and no changes were made.    EXAMINATION  Filed Vitals:    05/04/13 1434   BP: 130/85   Pulse: 68   Height: 1.727 m (5\' 8" )   Weight: 109.317 kg (241 lb)     Body mass index is 36.65 kg/(m^2).  Last 4 Weights    05/04/13 1434   Weight: 109.317 kg (241 lb)     PE;  HEENT-PER, EOMI, OP  clear, no tongue lesions, TMs nl,   Thyroid- without nodules or goiter.  LN neg at neck, supraclavicular area, axilla and groin.  Carotids no bruits.  Lungs resonant on percussion, clear on auscultation, no wheeze, no rales no rub.  Card exam shows  RRR with no mrumors, nl S1, S2, no S3, S4. PMI nondisplaced. Pulses symmetric at wrists and ankles, no abd bruits.  Abd shows no scars, normal BS, No HSM, no palpable masses and no tenderness.  MSK shows good ROM of wrists, elbows, shoulders, hips, knees and ankles, with no synovitis.  Skin-no lesions concerning for malignancy, no rashes.  GU-no testicular masses, no hernias.   Neuro-symmetric reflexes at wrists, elbows, knees and ankles, normal gait, facial muscles symmetric, normal strength in all extremities, speech was nl,   Psych-nl affect, no visible tension/anxiety. Good eye contact        IMPRESSION/PLAN   1. Add-continue with current dose of ritalin.  2. Anxiety-he feels med is helpful, no major side effects so will continue.   3. Tobacco-discussed, reviewed smoking cessation suggestions. He will pick a new quit date and stop.  Reviewed safety issues, re seatbelts, helmets, trauma prevention.  Alcohol use reviewed, recommendations discusssed  Aware of STD prevention strategies  Colonoscopy recommendations for colon cancer screening reviewed.   Reviewed skin cancer risk/prevention.  Reviewed self testicular examination.  Reviewed current issues and debate with prostate cancer screening.   Diet and exercise recommendations reviewed.  Immunizations brought up to date.   HCP reviewed-blank supplied

## 2013-07-12 ENCOUNTER — Other Ambulatory Visit: Payer: Self-pay | Admitting: Primary Care

## 2013-07-12 MED ORDER — METHYLPHENIDATE HCL 40 MG PO CP24 *A*
40.0000 mg | ORAL_CAPSULE | Freq: Every day | ORAL | Status: DC
Start: 2013-07-12 — End: 2013-10-10

## 2013-07-12 NOTE — Telephone Encounter (Signed)
istop checked

## 2013-08-12 ENCOUNTER — Other Ambulatory Visit: Payer: Self-pay | Admitting: Primary Care

## 2013-09-13 ENCOUNTER — Other Ambulatory Visit: Payer: Self-pay | Admitting: Primary Care

## 2013-10-10 ENCOUNTER — Other Ambulatory Visit: Payer: Self-pay | Admitting: Primary Care

## 2013-10-10 MED ORDER — METHYLPHENIDATE HCL 40 MG PO CP24 *A*
40.0000 mg | ORAL_CAPSULE | Freq: Every day | ORAL | Status: DC
Start: 2013-10-10 — End: 2014-01-04

## 2014-01-04 ENCOUNTER — Ambulatory Visit: Payer: Self-pay | Admitting: Primary Care

## 2014-01-04 VITALS — BP 120/85 | HR 62 | Ht 68.0 in | Wt 231.0 lb

## 2014-01-04 DIAGNOSIS — F419 Anxiety disorder, unspecified: Secondary | ICD-10-CM

## 2014-01-04 DIAGNOSIS — F988 Other specified behavioral and emotional disorders with onset usually occurring in childhood and adolescence: Secondary | ICD-10-CM

## 2014-01-04 DIAGNOSIS — Z23 Encounter for immunization: Secondary | ICD-10-CM

## 2014-01-04 MED ORDER — METHYLPHENIDATE HCL 30 MG PO CP24 *A*
30.0000 mg | ORAL_CAPSULE | Freq: Every day | ORAL | Status: DC
Start: 2014-01-04 — End: 2014-01-16

## 2014-01-04 NOTE — Progress Notes (Signed)
Patient presents today for...  Discussion of ADD and anxiety.  We had him on Lexapro 20 mg a day for his anxiety.  He is also on Ritalin 40 mg a day for focus.  We now focused seems to be okay but is more concerned about anxiety.  He feels his honeymoon for his new job is over and is seeing some of the stress and expectations increase.  The noise at school seems to get to him.  He also notes that since his wife is starting a new job she is a little but more stressed as well.  Used to be regular exercise but has fallen off out of the last 3 weeks.  He does have plans to talk with a counselor but was wondering if we should make any medication adjustments.        And his daughter is a Air traffic controller, interested in becoming a PA  He also has Dt in college, [employer], nursing, just started.   49 year old step, senior  49 year old step, feshman    MEDICAL PROBLEMS  Patient Active Problem List   Diagnosis Code    Anxiety F41.9    Depression F32.9    ADD (attention deficit disorder) F90.9       CURRENT ALLERGIES  Review of patient's allergies indicates no known allergies (drug, envir, food or latex).    CURRENT MEDICATIONS  Current Outpatient Prescriptions   Medication Sig    methylphenidate (RITALIN LA) 40 MG 24 hr capsule Take 1 capsule (40 mg total) by mouth daily (before breakfast)   Max daily dose: 40 mg MDD 40 mg  Code B    escitalopram (LEXAPRO) 20 MG tablet Take 1 tablet (20 mg total) by mouth daily    Multiple Vitamin (MULTIVITAMIN) per tablet Take 1 tablet by mouth daily     No current facility-administered medications for this visit.       The patients medications and allergies were reviewed, reconciled as needed and no changes were made.    EXAMINATION  Filed Vitals:    01/04/14 0855   BP: 120/85   Pulse: 62   Height: 1.727 m (5\' 8" )   Weight: 104.781 kg (231 lb)     Body mass index is 35.13 kg/(m^2).  Last 4 Weights    01/04/14 0855   Weight: 104.781 kg (231 lb)     Carotids are without bruits.  Lungs are  clear to auscultation.  Heart regular rate and rhythm normal S1-S2.    IMPRESSION/PLAN   Anxiety-we could do a trial of changing Lexapro at first like to make sure not overstimulating with the Ritalin.  We'll do a week trial dropping to 40 mg to 30 mg.  It improves things we'll continue with that dose.  Did support his idea to seek counseling as I suspect that will help him. we talked about getting back to the gym for more regular exercise if that will help as well.    ADD-trial of lowering the medication to 30 mg.  If focus worsens we'll increase back to 40.  There is no change in focus and anxiety doesn't improve could consider a drop to 20 mg.  Right now blood pressure seems to be normal so we need to go back to this dose would be acceptable.

## 2014-01-16 ENCOUNTER — Other Ambulatory Visit: Payer: Self-pay | Admitting: Primary Care

## 2014-01-16 MED ORDER — METHYLPHENIDATE HCL 30 MG PO CP24 *A*
30.0000 mg | ORAL_CAPSULE | Freq: Every day | ORAL | Status: DC
Start: 2014-01-16 — End: 2014-04-19

## 2014-01-16 NOTE — Telephone Encounter (Signed)
The 30 mg is working would like a 90 day supply.  istop checked

## 2014-02-11 ENCOUNTER — Other Ambulatory Visit: Payer: Self-pay | Admitting: Primary Care

## 2014-02-22 ENCOUNTER — Ambulatory Visit
Admit: 2014-02-22 | Discharge: 2014-02-22 | Disposition: A | Discharge status: Home / Self Care | Payer: Self-pay | Source: Ambulatory Visit | Attending: Primary Care | Admitting: Primary Care

## 2014-02-22 DIAGNOSIS — F988 Other specified behavioral and emotional disorders with onset usually occurring in childhood and adolescence: Secondary | ICD-10-CM

## 2014-02-22 LAB — CBC AND DIFFERENTIAL
Baso # K/uL: 0.1 10*3/uL (ref 0.0–0.1)
Basophil %: 0.6 %
Eos # K/uL: 0.1 10*3/uL (ref 0.0–0.5)
Eosinophil %: 1.6 %
Hematocrit: 46 % (ref 40–51)
Hemoglobin: 15.5 g/dL (ref 13.7–17.5)
IMM Granulocytes #: 0 10*3/uL (ref 0.0–0.1)
IMM Granulocytes: 0.5 %
Lymph # K/uL: 2.6 10*3/uL (ref 1.3–3.6)
Lymphocyte %: 31.3 %
MCH: 29 pg/cell (ref 26–32)
MCHC: 34 g/dL (ref 32–37)
MCV: 88 fL (ref 79–92)
Mono # K/uL: 0.7 10*3/uL (ref 0.3–0.8)
Monocyte %: 8.7 %
Neut # K/uL: 4.8 10*3/uL (ref 1.8–5.4)
Nucl RBC # K/uL: 0 10*3/uL (ref 0.0–0.0)
Nucl RBC %: 0 /100 WBC (ref 0.0–0.2)
Platelets: 194 10*3/uL (ref 150–330)
RBC: 5.3 MIL/uL (ref 4.6–6.1)
RDW: 12.6 % (ref 11.6–14.4)
Seg Neut %: 57.3 %
WBC: 8.3 10*3/uL (ref 4.2–9.1)

## 2014-02-22 LAB — BASIC METABOLIC PANEL
Anion Gap: 14 (ref 7–16)
CO2: 25 mmol/L (ref 20–28)
Calcium: 9.2 mg/dL (ref 8.6–10.2)
Chloride: 103 mmol/L (ref 96–108)
Creatinine: 1.01 mg/dL (ref 0.67–1.17)
GFR,Black: 100 *
GFR,Caucasian: 87 *
Glucose: 96 mg/dL (ref 60–99)
Lab: 22 mg/dL — ABNORMAL HIGH (ref 6–20)
Potassium: 4.3 mmol/L (ref 3.3–5.1)
Sodium: 142 mmol/L (ref 133–145)

## 2014-02-22 LAB — LIPID PANEL
Chol/HDL Ratio: 7.4
Cholesterol: 272 mg/dL — AB
HDL: 37 mg/dL
Non HDL Cholesterol: 235 mg/dL
Triglycerides: 704 mg/dL — AB

## 2014-02-22 LAB — LDL CHOLESTEROL, DIRECT: LDL Direct: 123 mg/dL

## 2014-02-23 ENCOUNTER — Telehealth: Payer: Self-pay | Admitting: Primary Care

## 2014-02-23 NOTE — Telephone Encounter (Signed)
There is already an appoint arranged, 02/24/14 to discuss labs.

## 2014-02-23 NOTE — Telephone Encounter (Signed)
-----   Message from Bea Graff, MD sent at 02/23/2014  3:11 PM EST -----  TG part of the chol is up a lot, probably worth a visit to discuss.

## 2014-02-24 ENCOUNTER — Ambulatory Visit: Payer: Self-pay | Admitting: Primary Care

## 2014-02-24 ENCOUNTER — Encounter: Payer: Self-pay | Admitting: Primary Care

## 2014-02-24 VITALS — BP 122/80 | HR 68 | Ht 68.0 in | Wt 234.0 lb

## 2014-02-24 DIAGNOSIS — E781 Pure hyperglyceridemia: Secondary | ICD-10-CM

## 2014-02-24 MED ORDER — FENOFIBRATE 48 MG PO TABS *I*
48.0000 mg | ORAL_TABLET | Freq: Every day | ORAL | Status: DC
Start: 2014-02-24 — End: 2014-08-12

## 2014-02-24 NOTE — Telephone Encounter (Signed)
Patient seen today

## 2014-02-24 NOTE — Progress Notes (Signed)
Patient presents today for...  Here to discuss elevated TG. He actually had a screening at work, BP was elevated, but it was first thing in the am, after some coffee. So they let him come back mid day and on the recheck was better, however they did the blood work part at the afternoon, ie non-fasting. He had a mildly elevated total chol (no breakdown done) so he decided to do some fasting labs. Those came back with a TG value in the 700 range. He says he does do too many carbs and weight is up some, however he's actually down from his peak weight (thinks he was 250 in Jan 2015).  He doesn't drink on a regular basis. He's not sure if there is FH of TG issues. Sugar value was normal on both checks.     MEDICAL PROBLEMS  Patient Active Problem List   Diagnosis Code    Anxiety F41.9    Depression F32.9    ADD (attention deficit disorder) F90.9       CURRENT ALLERGIES  Review of patient's allergies indicates no known allergies (drug, envir, food or latex).    CURRENT MEDICATIONS  Current Outpatient Prescriptions   Medication Sig    methylphenidate (RITALIN LA) 30 MG 24 hr capsule Take 1 capsule (30 mg total) by mouth daily (before breakfast)   Max daily dose: 30 mg MDD 40 mg  Code B    escitalopram (LEXAPRO) 20 MG tablet Take 1 tablet (20 mg total) by mouth daily    Multiple Vitamin (MULTIVITAMIN) per tablet Take 1 tablet by mouth daily    fenofibrate (TRICOR) 48 MG tablet Take 1 tablet (48 mg total) by mouth daily     No current facility-administered medications for this visit.       The patients medications and allergies were reviewed, reconciled as needed and no changes were made.    EXAMINATION  Filed Vitals:    02/24/14 0759   BP: 122/80   Pulse: 68   Height: 1.727 m (5\' 8" )   Weight: 106.142 kg (234 lb)     Body mass index is 35.59 kg/(m^2).  Last 4 Weights    02/24/14 0759   Weight: 106.142 kg (234 lb)             IMPRESSION/PLAN   Hypertriglyceridemia-given how elevated he is, I think we need to consider  medication in addition to diet and weight management. We did discuss the later and he has intention to work on that aspect. Reviewed possible side effects of the tricor (up to date site) with him. We'll get a recheck in a couple of weeks. If he is able to drop more weight and get additional reductions after starting the med did discuss that we could periodically stop med and see how low he can be on his own.  TT with pt 15 min all counseling re diet and meds to manage.

## 2014-04-19 ENCOUNTER — Other Ambulatory Visit: Payer: Self-pay | Admitting: Primary Care

## 2014-04-19 MED ORDER — METHYLPHENIDATE HCL 30 MG PO CP24 *A*
30.0000 mg | ORAL_CAPSULE | Freq: Every day | ORAL | Status: DC
Start: 2014-04-19 — End: 2014-07-24

## 2014-04-19 NOTE — Telephone Encounter (Signed)
istop checked

## 2014-07-12 ENCOUNTER — Encounter: Payer: Self-pay | Admitting: Gastroenterology

## 2014-07-24 ENCOUNTER — Other Ambulatory Visit: Payer: Self-pay | Admitting: Primary Care

## 2014-07-24 MED ORDER — METHYLPHENIDATE HCL 30 MG PO CP24 *A*
30.0000 mg | ORAL_CAPSULE | Freq: Every day | ORAL | Status: DC
Start: 2014-07-24 — End: 2014-10-20

## 2014-07-24 NOTE — Telephone Encounter (Signed)
istop checked

## 2014-08-10 ENCOUNTER — Other Ambulatory Visit: Payer: Self-pay | Admitting: Primary Care

## 2014-08-11 ENCOUNTER — Other Ambulatory Visit: Payer: Self-pay | Admitting: Primary Care

## 2014-08-12 ENCOUNTER — Other Ambulatory Visit: Payer: Self-pay | Admitting: Primary Care

## 2014-08-16 ENCOUNTER — Encounter: Payer: Self-pay | Admitting: Gastroenterology

## 2014-09-06 ENCOUNTER — Other Ambulatory Visit: Payer: Self-pay | Admitting: Primary Care

## 2014-10-20 ENCOUNTER — Other Ambulatory Visit: Payer: Self-pay | Admitting: Primary Care

## 2014-10-20 MED ORDER — METHYLPHENIDATE HCL 30 MG PO CP24 *A*
30.0000 mg | ORAL_CAPSULE | Freq: Every day | ORAL | 0 refills | Status: DC
Start: 2014-10-20 — End: 2015-01-25

## 2014-10-20 NOTE — Telephone Encounter (Signed)
istop checked

## 2015-01-01 ENCOUNTER — Telehealth: Payer: Self-pay | Admitting: Primary Care

## 2015-01-01 DIAGNOSIS — Z1211 Encounter for screening for malignant neoplasm of colon: Secondary | ICD-10-CM

## 2015-01-01 NOTE — Telephone Encounter (Signed)
The patient needs a referral for a Cancer screening colonoscopy,Please add.

## 2015-01-02 ENCOUNTER — Encounter: Payer: Self-pay | Admitting: Primary Care

## 2015-01-23 ENCOUNTER — Encounter: Payer: Self-pay | Admitting: Primary Care

## 2015-01-23 NOTE — Telephone Encounter (Signed)
No response to call, letter sent.

## 2015-01-25 ENCOUNTER — Other Ambulatory Visit: Payer: Self-pay | Admitting: Primary Care

## 2015-01-25 MED ORDER — METHYLPHENIDATE HCL 30 MG PO CP24 *A*
30.0000 mg | ORAL_CAPSULE | Freq: Every day | ORAL | 0 refills | Status: DC
Start: 2015-01-25 — End: 2015-04-24

## 2015-01-25 NOTE — Telephone Encounter (Signed)
istop checked

## 2015-02-14 ENCOUNTER — Telehealth: Payer: Self-pay

## 2015-02-14 DIAGNOSIS — Z1211 Encounter for screening for malignant neoplasm of colon: Secondary | ICD-10-CM

## 2015-02-14 NOTE — Telephone Encounter (Signed)
Referral placed.

## 2015-02-14 NOTE — Telephone Encounter (Signed)
Would like a referral for a colonoscopy.  Please advise.

## 2015-02-19 NOTE — Telephone Encounter (Signed)
Referral placed and GI trying to contact patient.

## 2015-02-23 ENCOUNTER — Other Ambulatory Visit: Payer: Self-pay | Admitting: Primary Care

## 2015-04-06 ENCOUNTER — Telehealth: Payer: Self-pay | Admitting: Primary Care

## 2015-04-06 DIAGNOSIS — G4730 Sleep apnea, unspecified: Secondary | ICD-10-CM

## 2015-04-06 NOTE — Telephone Encounter (Signed)
Referral placed.

## 2015-04-06 NOTE — Telephone Encounter (Signed)
Interested in getting sleep study done.  Had one years ago, never followed up on the cpap.

## 2015-04-10 ENCOUNTER — Telehealth: Payer: Self-pay

## 2015-04-10 NOTE — Telephone Encounter (Signed)
Mr. Willinger needs to be scheduled for a colonoscopy. There are no schedules available, patient is requesting a Monday.    Was the patient referred to see a particular provider? no    Please call the patient to schedule at [phone].    1.  Is the patient taking any prescribed blood thinners? no     2.  Is the patient on any type of Kidney Dialysis? no    3.  Does the patient have any type of cardiac device or a defibrillator? no    4.  Does the patient use a machine to help you breathe at night? no    5.  Is the patients weight greater than 350lbs? no    6.  Is the patient Diabetic? no

## 2015-04-10 NOTE — Telephone Encounter (Signed)
Pt returned the call.

## 2015-04-10 NOTE — Telephone Encounter (Signed)
Called patient to schedule COD with any provider. No answer. Left VM to call back.

## 2015-04-11 NOTE — Telephone Encounter (Signed)
Mr. Wehe is returning a call from [place]. He states this is regarding scheduling COD. He is requesting a call back at [phone] before 3pm today. Thank you.

## 2015-04-11 NOTE — Telephone Encounter (Signed)
Returned patients call to schedule COD. No answer. Left VM to call back.

## 2015-04-11 NOTE — Telephone Encounter (Signed)
Spoke with patient and he is scheduled for screening colonoscopy on 07/30/15 for 12:45 pm arrival at [facility] location. Patient confirmed. Will mail prep instructions.

## 2015-04-24 ENCOUNTER — Other Ambulatory Visit: Payer: Self-pay | Admitting: Primary Care

## 2015-04-24 MED ORDER — METHYLPHENIDATE HCL 30 MG PO CP24 *A*
30.0000 mg | ORAL_CAPSULE | Freq: Every day | ORAL | 0 refills | Status: DC
Start: 2015-04-24 — End: 2015-07-24

## 2015-04-24 NOTE — Telephone Encounter (Signed)
I left a detailed message to call the office back.

## 2015-04-24 NOTE — Telephone Encounter (Signed)
Its been over a year since I've seen him, on this med, needs a follow up.

## 2015-04-24 NOTE — Telephone Encounter (Signed)
istop checked

## 2015-04-26 ENCOUNTER — Telehealth: Payer: Self-pay | Admitting: Primary Care

## 2015-04-26 ENCOUNTER — Ambulatory Visit: Payer: Self-pay | Admitting: Primary Care

## 2015-04-26 ENCOUNTER — Other Ambulatory Visit: Payer: Self-pay | Admitting: Primary Care

## 2015-04-26 VITALS — BP 130/100 | HR 73 | Temp 98.1°F | Ht 68.0 in | Wt 244.0 lb

## 2015-04-26 DIAGNOSIS — R0681 Apnea, not elsewhere classified: Secondary | ICD-10-CM

## 2015-04-26 DIAGNOSIS — E781 Pure hyperglyceridemia: Secondary | ICD-10-CM

## 2015-04-26 DIAGNOSIS — F988 Other specified behavioral and emotional disorders with onset usually occurring in childhood and adolescence: Secondary | ICD-10-CM

## 2015-04-26 DIAGNOSIS — I10 Essential (primary) hypertension: Secondary | ICD-10-CM

## 2015-04-26 LAB — COMPREHENSIVE METABOLIC PANEL
ALT: 15 U/L (ref 0–50)
AST: 30 U/L (ref 0–50)
Albumin: 4.5 g/dL (ref 3.5–5.2)
Alk Phos: 67 U/L (ref 40–130)
Anion Gap: 15 (ref 7–16)
Bilirubin,Total: 0.6 mg/dL (ref 0.0–1.2)
CO2: 25 mmol/L (ref 20–28)
Calcium: 9.4 mg/dL (ref 8.6–10.2)
Chloride: 101 mmol/L (ref 96–108)
Creatinine: 1.11 mg/dL (ref 0.67–1.17)
GFR,Black: 89 *
GFR,Caucasian: 77 *
Glucose: 89 mg/dL (ref 60–99)
Lab: 25 mg/dL — ABNORMAL HIGH (ref 6–20)
Potassium: 5.3 mmol/L — ABNORMAL HIGH (ref 3.3–5.1)
Sodium: 141 mmol/L (ref 133–145)
Total Protein: 7 g/dL (ref 6.3–7.7)

## 2015-04-26 LAB — LIPID PANEL
Chol/HDL Ratio: 4.1
Cholesterol: 214 mg/dL — AB
HDL: 52 mg/dL
LDL Calculated: 127 mg/dL
Non HDL Cholesterol: 162 mg/dL
Triglycerides: 175 mg/dL — AB

## 2015-04-26 MED ORDER — LOSARTAN POTASSIUM 25 MG PO TABS *I*
25.0000 mg | ORAL_TABLET | Freq: Every day | ORAL | 5 refills | Status: DC
Start: 2015-04-26 — End: 2015-10-19

## 2015-04-26 NOTE — Progress Notes (Signed)
Patient presents today for...  We asked him to come in today because his been a while since we seen him and he is on methylphenidate for the ADD.  1. ADD-he actually feels the medicine is helpful helps his focus.  He doesn't feel overstimulated with it.  No problems with insomnia.  2.  Anxiety/depression-he feels the Lexapro is helpful as well.  No fatigue her GI side effects.  No excess sweats.  Wonders if some of his weight gain might be related to this.  3.  Possible sleep apnea-he did do a study-years ago, did have one, was told then it was borderline. He is up weight, 10 pounds this past year. Hearing some pauses with his sleep per his wife. Not too sleepy during the day, but he realizes that he might be accepting a level of tiredness all these years.   4.  Bronchitis-was in urgent care over the weekend.  Was given a pro-air inhaler and some Tessalon.  5.  Smoking-since getting his bronchitis he stopped the cigars.  He was asking about whether he should go on patches or medication.  Right now he doesn't feel any great tries to smoke.  6.  Hypertension-blood pressure was also elevated when he was at urgent care.  Recheck it today and it somewhat elevated.  No chest pain or palpitations.        MEDICAL PROBLEMS  Patient Active Problem List   Diagnosis Code    Anxiety F41.9    Depression F32.9    ADD (attention deficit disorder) F98.8       CURRENT ALLERGIES  Review of patient's allergies indicates no known allergies (drug, envir, food or latex).    CURRENT MEDICATIONS  Current Outpatient Prescriptions   Medication Sig    methylphenidate (RITALIN LA) 30 MG 24 hr capsule Take 1 capsule (30 mg total) by mouth daily (before breakfast)   Max daily dose: 30 mg MDD 40 mg  Code B    fenofibrate (TRICOR) 48 MG tablet TAKE 1 TABLET (48 MG TOTAL) BY MOUTH DAILY    escitalopram (LEXAPRO) 20 MG tablet Take 1 tablet (20 mg total) by mouth daily    Multiple Vitamin (MULTIVITAMIN) per tablet Take 1 tablet by mouth daily      No current facility-administered medications for this visit.        The patients medications and allergies were reviewed, reconciled as needed andHe also takes vitamin D 1000 initially units daily.      EXAMINATION  Vitals:    04/26/15 0841   BP: (!) 130/100   Pulse: 73   Temp: 36.7 C (98.1 F)   SpO2: 94%   Weight: 110.7 kg (244 lb)   Height: 1.727 m (5\' 8" )     Body mass index is 37.1 kg/(m^2).  Last 4 Weights    04/26/15 0841   Weight: 110.7 kg (244 lb)     Carotids are without bruits  Thyroid is nl, no nodules  Lungs are clear, no wheeze, no rales  Card exam shows a rrr, nl S1, S2, no murmurs  Ext show no edema. Good DP pulses.          IMPRESSION/PLAN   1.  ADD-we decided to continue the Ritalin as he feels its beneficial.  We discussed the option of cutting back on it in order to work the pressure down in case that was a factor but he'd rather treat around this if that was the issue.  2.  Hypertension-could be  medication however also could be increased weight or untreated sleep apnea.  Regardless need to treat bring pressure down.  Starting him on losartan 25 mg a day.  We did discuss some of the side effects.  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  Getting lab work so we'll check his electrolytes now.  3.  Hypertriglyceridemia-has not had a level checked since starting his try core.  He's been fasting today so he'll get the blood work for [place].  4.  Possible sleep apnea-he's got a referral and appointment to see them in April.  Seems appropriate.  5.  Smoking cessation-if he does start to get cravings we can discuss options right now he would prefer not to use medicines are patches if he doesn't need to.  6.  Anxiety/depression-we'll continue her citalopram at 20 mg.

## 2015-04-26 NOTE — Telephone Encounter (Signed)
I spoke to the patient and advised him that Chol is much better, TG were 704 and now are 175. His potassium level was a couple of tenths above the normal range, not an issue, but since the BP med we gave him occasionally causes that to go up I'd like him to do a recheck after he's been on the med for a week.

## 2015-04-26 NOTE — Telephone Encounter (Signed)
-----   Message from Bea Graff, MD sent at 04/26/2015  1:59 PM EST -----  Chol is much better, TG were 704 and now are 175. His potassium level was a couple of tenths above the normal range, not an issue, but since the BP med we gave him occasionally causes that to go up I'd like him to do a recheck after he's been on the med for a week.

## 2015-05-02 ENCOUNTER — Encounter: Payer: Self-pay | Admitting: Optometry

## 2015-05-02 ENCOUNTER — Ambulatory Visit: Payer: Self-pay | Admitting: Optometry

## 2015-05-02 VITALS — BP 113/80 | HR 80 | Ht 68.0 in | Wt 237.0 lb

## 2015-05-02 DIAGNOSIS — H52223 Regular astigmatism, bilateral: Secondary | ICD-10-CM

## 2015-05-02 DIAGNOSIS — H5213 Myopia, bilateral: Secondary | ICD-10-CM

## 2015-05-02 DIAGNOSIS — H524 Presbyopia: Secondary | ICD-10-CM

## 2015-05-02 NOTE — Progress Notes (Signed)
Assessment:       1. Myopia, bilateral    2. Regular astigmatism, bilateral    3. Presbyopia           Plan:     1.  Dispense trials of existing Avaira toric in monovision format.  [place] to try initially at home and if they seem to work well, he'll try at work.  If needed, return for follow-up with mdd.  Yoshinobu understands limitations of toric multifocal contact lenses and prefers to avoid OTC readers.    2.  Optional Rx change.    3.  CEE every 2 years or sooner if symptoms warrant

## 2015-05-16 ENCOUNTER — Telehealth: Payer: Self-pay

## 2015-05-16 NOTE — Telephone Encounter (Signed)
Patient was instructed to call regarding his most recent contact lens trials from 05/02/15 and have Dr. Clayborn Heron give him a call.

## 2015-05-23 NOTE — Telephone Encounter (Signed)
Please call patient - Trial lens' are not working properly for him.

## 2015-05-24 ENCOUNTER — Ambulatory Visit
Admission: RE | Admit: 2015-05-24 | Discharge: 2015-05-24 | Disposition: A | Discharge status: Home / Self Care | Payer: Self-pay | Source: Ambulatory Visit | Attending: Primary Care | Admitting: Primary Care

## 2015-05-24 ENCOUNTER — Telehealth: Payer: Self-pay | Admitting: Primary Care

## 2015-05-24 DIAGNOSIS — I10 Essential (primary) hypertension: Secondary | ICD-10-CM

## 2015-05-24 LAB — BASIC METABOLIC PANEL
Anion Gap: 12 (ref 7–16)
CO2: 27 mmol/L (ref 20–28)
Calcium: 9.7 mg/dL (ref 8.6–10.2)
Chloride: 102 mmol/L (ref 96–108)
Creatinine: 1.03 mg/dL (ref 0.67–1.17)
GFR,Black: 97 *
GFR,Caucasian: 84 *
Glucose: 92 mg/dL (ref 60–99)
Lab: 24 mg/dL — ABNORMAL HIGH (ref 6–20)
Potassium: 4.5 mmol/L (ref 3.3–5.1)
Sodium: 141 mmol/L (ref 133–145)

## 2015-05-24 NOTE — Telephone Encounter (Signed)
I spoke to the patient and advised him that lab test looks good, noted his last BP looked good as well so we'll keep him on the meds he's on.

## 2015-05-24 NOTE — Telephone Encounter (Signed)
-----   Message from Bea Graff, MD sent at 05/24/2015  3:01 PM EST -----  Lab test looks good, noted his last BP looked good as well so we'll keep him on the meds he's on.

## 2015-05-25 ENCOUNTER — Telehealth: Payer: Self-pay | Admitting: Optometry

## 2015-05-25 NOTE — Telephone Encounter (Signed)
Patient called again stating  that contact lens trials do not work well for him. Per your chart notes from last visit, informed patient to set up appointment. Patient to call back when he has  his schedule.

## 2015-05-25 NOTE — Telephone Encounter (Signed)
Returned Ronald Hughes's phone call and left message.  If his trial lenses are not functioning well, he could 1) return to distance vision only contact lenses and use OTC readers prn, 2) return for a Level #2 contact lens fitting (with JJB) to refine his most recent trial lenses, or 3) wait till Biofinity Toric Multifocal lens is available (anticipated launch date in 3-6 months and likely level #5 refitting).  mdd

## 2015-06-14 ENCOUNTER — Telehealth: Payer: Self-pay | Admitting: Sleep Medicine

## 2015-06-14 NOTE — Telephone Encounter (Signed)
Confirmed 06/18/15 appt with patient

## 2015-06-18 ENCOUNTER — Ambulatory Visit: Payer: Self-pay | Admitting: Sleep Medicine

## 2015-06-18 ENCOUNTER — Encounter: Payer: Self-pay | Admitting: Sleep Medicine

## 2015-06-18 VITALS — BP 174/99 | HR 87 | Ht 68.0 in | Wt 242.0 lb

## 2015-06-18 DIAGNOSIS — E669 Obesity, unspecified: Secondary | ICD-10-CM

## 2015-06-18 DIAGNOSIS — G4733 Obstructive sleep apnea (adult) (pediatric): Secondary | ICD-10-CM

## 2015-06-18 DIAGNOSIS — I10 Essential (primary) hypertension: Secondary | ICD-10-CM

## 2015-06-18 NOTE — Progress Notes (Signed)
[facility] Patient Visit       Referred to the [facility] by Dr. Bea Graff, MD for evaluation.     HISTORY       Dear Dr. Lorna Dibble,    This morning I evaluated Ronald Hughes at the [facility].    Ronald Hughes is a 50 y.o. man who presents today for reevaluation of obstructive sleep apnea.  He was originally referred for an overnight polysomnogram in our office in 2008.  This study had been arranged by his otolaryngologist.  This study was consistent with a diagnosis of severe obstructive sleep apnea.  His apnea hypopnea index (AHI) was 53.8 respiratory events per hour of sleep.  His oxygen saturation nadir was 80%.  He spent 10.5% of total sleep time at an oxygen saturation below 90%.  Following this study he did not pursue further evaluation or treatment for sleep disordered breathing.  At the time to study his weight was 215 pounds.    Currently he reports that his sleep is not refreshing.  He goes to bed around 11 PM and wakes up at 6:30 AM.  He is up once per night generally.  He does not feel refreshed upon awakening.  He has a sense of tiredness throughout the day.  He'll take 1 hour naps intermittently.  He is noted to have loud disruptive snoring.  His wife notes gasping arousals.  He does not grind his teeth.  He does wake up with a dry mouth.  He is a restless sleeper.    He typically feels sleepy during the day.  He uses a significant amount of caffeine typically 6 beverages per day.  He is also on methylphenidate LA 30 mg daily which may augment his sleepiness.  His Epworth Sleepiness Scale score is  9/24,  suggestive of moderate daytime sleepiness.     ACTIVE PROBLEM LIST     Patient Active Problem List   Diagnosis Code    Anxiety F41.9    Depression F32.9    ADD (attention deficit disorder) F98.8    Unspecified essential hypertension I10    Obesity E66.9    OSA (obstructive sleep apnea) G47.33     REVIEW OF SYSTEMS    CONSTITUTIONAL: Appetite  good, no fevers, night sweats or weight loss   EYES: No visual changes, no eye pain   ENT: No hearing difficulties, no ear pain   CV: No chest pain, shortness of breath or peripheral edema   RESPIRATORY: No cough, wheezing, or dyspnea   GI: No nausea/vomiting, abdominal pain, or change in bowel habits   GU: No dysuria, urgency or incontinence   MS:  No pain   SKIN: No rashes   PSYCH: No depression or anxiety   ENDOCRINE: No polyuria/polydipsia, no heat intolerance   HEME/LYMPH: No easy bleeding/bruising or swollen nodes   NEURO: No seizures, no headaches    FAMILY HISTORY      family history includes Arthritis in his brother and father; Cancer in his father, maternal grandfather, maternal grandmother, and mother; Dementia (age of onset: 76) in his father; No Known Problems in his brother, maternal aunt, maternal uncle, paternal aunt, paternal grandmother, and paternal uncle; Parkinsonism in his paternal grandfather. There is no history of Cataracts, Diabetes, Glaucoma, or Macular degeneration.  No family history of sleep disorders.    SOCIAL HISTORY     Social History     Social History    Marital status:  Married     Spouse name: N/A    Number of children: N/A    Years of education: N/A     Social History Main Topics    Smoking status: Current Every Day Smoker    Smokeless tobacco: None      Comment: 1 cigars a day    Alcohol use Yes      Comment: occ    Drug use: No    Sexual activity: Not Asked     Other Topics Concern    None     Social History Narrative       ALLERGIES     No Known Allergies (drug, envir, food or latex)    MEDICATIONS        Current Outpatient Prescriptions   Medication Sig    Cholecalciferol (VITAMIN D PO) Take by mouth    losartan (COZAAR) 25 MG tablet Take 1 tablet (25 mg total) by mouth daily    methylphenidate (RITALIN LA) 30 MG 24 hr capsule Take 1 capsule (30 mg total) by mouth daily (before breakfast)   Max daily dose: 30 mg MDD 40 mg  Code B    fenofibrate  (TRICOR) 48 MG tablet TAKE 1 TABLET (48 MG TOTAL) BY MOUTH DAILY    escitalopram (LEXAPRO) 20 MG tablet Take 1 tablet (20 mg total) by mouth daily    Multiple Vitamin (MULTIVITAMIN) per tablet Take 1 tablet by mouth daily     No current facility-administered medications for this visit.        VITAL SIGNS   BP (!) 174/99  Pulse 87  Ht 1.727 m (5\' 8" )  Wt 109.8 kg (242 lb)  SpO2 95%  BMI 36.8 kg/m2  Neck Circumference: 18.5 inches.    PHYSICAL EXAMINATION     General: obese, comfortable appearing, and in no apparent distress   Sclera: anicteric and conjunctiva pink   Nasal passages: patent   Oropharynx: high arched hard palate, redundant soft palate    maxilla and mandible: normal       Lungs: clear to auscultation bilaterally  Heart: normal heart sounds, regular rate and rhythm   Extremities: normal, without edema, no clubbing  Neurologic: Alert and attentive, speech is fluent, eye movements are full, pupils react equally to light, face is symmetric, normal bulk and tone. Normal based gait.    ASSESSMENT      Obstructive Sleep Apnea:    Ronald Hughes is a 50 y.o. man with a hx of OSA originally diagnosed in 2008 (severe OSA - AHI 53.8). He was not treated for OSA following this study. He presents now with symptoms concerning for OSA - as he has non refreshing sleep, loud disruptive snoring, and gasping arousals. He has gained ~30 lbs since his initial evaluation. Overall his history and physical examination raise concern for a diagnosis of obstructive sleep apnea (OSA).     PLAN   1) I will obtain a home sleep study to further evaluate. Should the test be unrevealing we will need to consider in lab polysomnography.  2) I reviewed the pathophysiology of obstructive sleep apnea and its association with cardiovascular disease.  3) I stressed the importance of weight loss in the management of obstructive sleep apnea.  4) We reviewed potential treatment options including weight loss, mandibular advancement appliance,  palatal surgery, and a trial of nasal CPAP.  5) He will follow up on mychart (using our EMR) 1 to 2 weeks after completion of his home sleep apnea screening  study.     Thank you for allowing me to participate in the care of your patient.  Please do not hesitate to contact my office with any questions.    Sincerely,  Larene Pickett, MD  [facility]

## 2015-06-18 NOTE — Telephone Encounter (Signed)
Mailed prep instructions to patient.

## 2015-06-20 ENCOUNTER — Ambulatory Visit: Payer: Self-pay | Admitting: Optometry

## 2015-06-26 ENCOUNTER — Telehealth: Payer: Self-pay

## 2015-06-26 NOTE — Telephone Encounter (Signed)
LMOM

## 2015-06-27 NOTE — Progress Notes (Signed)
[facility] STUDY POST TEST QUESTIONNAIRE      Patient Name:        Ronald Hughes    MRN #  [id number]      1. What was your approximate bedtime? _________________________      2.  How long did it take you to fall asleep? _________________________      3. How many times did you wake up? _________________________      a. Approximatley what time? / How long were you awake?    _____________/_______________    _____________/_______________    _____________/_______________    _____________/_______________    4. At what time did you get up for the day? __________________________    5. Any problems or comments?  __________________________               Patient signature:______________________   Date:_______________            Please Complete this page  &  Return with Sleep Study Case.Marland KitchenMarland KitchenThank You!                                                                             [facility]    HOME SLEEP TESTING EQUIPMENT AGREEMENT FORM      PATIENT NAME:  Ronald Hughes     MRN #:    [id number]           PHONE NUMBER:   [phone]     ADDRESS:   [place] [address]    I have received a Home Sleep Monitor (MediByte Brooke [place]ito.) from the [facility] and have been instructed how to use it.    Serial Number: [id number]    I have been informed that I am to wear this monitor for one night.    I have agreed to return this monitor to [address] on  ______________ between 6:00 AM and 4:30 PM     If you have any questions during the evening or night, please call  our office at [phone].    The monitor is to be dropped off at the front desk at [facility].  Please be sure that it is packaged in the case that was given to you.    This monitor is the property of [facility].  The value of this equipment is $2,495.  If you fail to return it by the date stated above, I understand that the Hospitals legal authorities will be notified.      Failure  to return this monitor is considered theft of hospital property,      Signed:  ________________________   Date: _________       Witness:  _______________________   Date: _________

## 2015-06-28 ENCOUNTER — Ambulatory Visit: Payer: Self-pay

## 2015-06-28 DIAGNOSIS — G4733 Obstructive sleep apnea (adult) (pediatric): Secondary | ICD-10-CM

## 2015-06-28 NOTE — Progress Notes (Signed)
[facility] SLEEP TEST FORM    Patient name: Ronald Hughes    MRN # :  [id number]    ESS:   9   NECK:  19.5 "      What is your usual bedtime?   11:00 p.m.  What is your usual risetime?  6:30 a.m.  What time did you wake up today? 6:45 a.m.  How many hours of sleep last night/day? 7 hours    Plan on taking any naps later today?   No    Plan on any alcohol or caffeine intake after dinner time? No    Will you be wearing an Oral Appliance during the study?  No    Current Outpatient Prescriptions on File Prior to Visit   Medication Sig Dispense Refill    Cholecalciferol (VITAMIN D PO) Take by mouth      losartan (COZAAR) 25 MG tablet Take 1 tablet (25 mg total) by mouth daily 30 tablet 5    methylphenidate (RITALIN LA) 30 MG 24 hr capsule Take 1 capsule (30 mg total) by mouth daily (before breakfast)   Max daily dose: 30 mg MDD 40 mg  Code B 90 capsule 0    fenofibrate (TRICOR) 48 MG tablet TAKE 1 TABLET (48 MG TOTAL) BY MOUTH DAILY 30 tablet 5    escitalopram (LEXAPRO) 20 MG tablet Take 1 tablet (20 mg total) by mouth daily 90 tablet 3    Multiple Vitamin (MULTIVITAMIN) per tablet Take 1 tablet by mouth daily       No current facility-administered medications on file prior to visit.        Patient Active Problem List   Diagnosis Code    Anxiety F41.9    Depression F32.9    ADD (attention deficit disorder) F98.8       Patient Statement    I have received a Home Sleep Monitor (MediByte Jr.) from the [facility] and have been instructed how to use it.    [name]    Sleep Diary Entries    1)   What was your approximate bedtime? 11:15 p.m.    2)   How long did it take you to fall asleep? 30 minutes  3)  How many times do you remember waking up? 2-3x     A) Approx. Time ? / How long were you awake?     2:00/  30 minutes    4:00/  30 minutes     4) What time did you finally wake up for the day?  7:30 a.m.    5)  Any problems or Comments?    Uncomfortable nights  sleep        [name]

## 2015-06-29 NOTE — Procedures (Signed)
This is a report for a nocturnal, unattended home sleep test completed by the [facility] on June 28, 2015.    This study utilized a 6-channel Type III home sleep respiratory recorder.  The following parameters were recorded for a duration of 363 minutes: snoring (high frequency vibrations in airflow), oronasal pressure airflow, RIP chest effort, SpO2, pulse rate, and body position.  There was no EEG monitoring during this study, as such we were not able to discern between periods of sleep and wake.      This study is suggestive of a diagnosis of severe obstructive sleep apnea. His unattended apnea hypopnea index (uAHI) was 59.8 respiratory events per hour of recording time. His oxygen saturation nadir was 72%.     This study was electronically signed by Larene Pickett, MD on 07/03/2015 at 1:34 PM.

## 2015-07-03 DIAGNOSIS — I10 Essential (primary) hypertension: Secondary | ICD-10-CM | POA: Insufficient documentation

## 2015-07-03 DIAGNOSIS — G4733 Obstructive sleep apnea (adult) (pediatric): Secondary | ICD-10-CM | POA: Insufficient documentation

## 2015-07-03 DIAGNOSIS — E669 Obesity, unspecified: Secondary | ICD-10-CM | POA: Insufficient documentation

## 2015-07-07 ENCOUNTER — Ambulatory Visit
Admission: AD | Admit: 2015-07-07 | Discharge: 2015-07-07 | Disposition: A | Discharge status: Home / Self Care | Payer: Self-pay | Attending: Family Medicine | Admitting: Family Medicine

## 2015-07-07 DIAGNOSIS — L570 Actinic keratosis: Secondary | ICD-10-CM

## 2015-07-07 NOTE — Discharge Instructions (Signed)
PLEASE REVIEW ALL INSTRUCTIONS FOR DETAILS AND CONTENT CONTAINED IN THE DISCHARGE MATERIALS NOT COVERED AT DISCHARGE.    Thank you Ronald Hughes for coming to [facility] for your health care concerns.    If your condition changes and/or worsens please follow up with your primary doctor and/or return to the urgent care center.    In the event of an emergency please dial 911.

## 2015-07-07 NOTE — UC Provider Note (Signed)
History     Chief Complaint   Patient presents with    Skin Problem     patient states he has "a couple spots on the back of my head" noticed them about 3 months ago, denies itching, drainage, patient states he is concerned and would like someone to look at them before he makes an appt with a dermatologist     Patient is a 50 y.o. male presenting with skin problem.   History provided by:  Patient  Language interpreter used: No    Is this ED visit related to civilian activity for income:  Not work related  Skin Problem   Location:  Head/neck  Head/neck rash location:  Scalp  Quality: itchiness and redness    Severity:  Mild  Onset quality:  Sudden  Duration:  3 months  Timing:  Constant  Progression:  Worsening  Chronicity:  New  Relieved by:  Nothing  Worsened by:  Nothing  Ineffective treatments:  None tried  Associated symptoms: no fatigue, no fever and no headaches    Patient has been scratching at lesions and they "come off"    Past Medical History:   Diagnosis Date    ADD (attention deficit disorder)     Anxiety     Dry eye syndrome             History reviewed. No pertinent surgical history.    Family History   Problem Relation Age of Onset    Cancer Mother      lung    Arthritis Father     Cancer Father      renal    Dementia Father 51    Cancer Maternal Grandmother      brain    Cancer Maternal Grandfather      lung    No Known Problems Brother     Arthritis Brother      Back, knee    No Known Problems Paternal Grandmother     Parkinsonism Paternal Grandfather     No Known Problems Maternal Aunt     No Known Problems Maternal Uncle     No Known Problems Paternal Aunt     No Known Problems Paternal Uncle     Cataracts Neg Hx     Diabetes Neg Hx     Glaucoma Neg Hx     Macular degeneration Neg Hx          Social History    reports that he has been smoking.  He does not have any smokeless tobacco history on file. He reports that he drinks alcohol. He reports that he does not use  illicit drugs. His sexual activity history is not on file.    Living Situation     Questions Responses    Patient lives with     Homeless     Caregiver for other family member     External Services     Employment     Domestic Violence Risk           Review of Systems   Review of Systems   Constitutional: Negative for activity change, appetite change, chills, fatigue and fever.   Skin: Positive for color change and wound. Negative for pallor and rash.   Neurological: Negative for dizziness and headaches.   Psychiatric/Behavioral: Negative for agitation, behavioral problems and confusion.       Physical Exam     ED Triage Vitals   BP Heart Rate Heart Rate (via  Pulse Ox) Resp Temp Temp src SpO2 O2 Device O2 Flow Rate   07/07/15 1419 07/07/15 1419 -- -- 07/07/15 1419 07/07/15 1419 07/07/15 1419 -- --   158/109 77   36.9 C (98.4 F) TEMPORAL 98 %        Weight           07/07/15 1419           106.6 kg (235 lb)                    Physical Exam   Constitutional: He appears well-developed and well-nourished.   HENT:   Head: Normocephalic and atraumatic.   Pulmonary/Chest: Effort normal and breath sounds normal.   Skin:        Left occiput with 5 mm wound right occiput with 2 mm eccentric pigmented lesion   Psychiatric: He has a normal mood and affect. His behavior is normal. Judgment and thought content normal.   Nursing note and vitals reviewed.           Medical Decision Making        Initial Evaluation:  ED First Provider Contact     Date/Time Event User Comments    07/07/15 1431 ED Provider First Contact [facility], Colin Ellers Initial Face to Face Provider Contact          Patient seen by me on arrival date of 07/07/2015 at at time of arrival  2:15 PM.  Initial face to face evaluation time noted above may be discrepant due to patient acuity and delay in documentation.    Assessment:  50 y.o.male comes to the [facility]gent [facility] with scalp lesions    Differential Diagnosis includes   Actinic Keratosis  Basal  Contact  Dermatitis  Type IV Contact Dermatitis  Eczema  Viral exanthem   Skin infection  Cellulitis  Abscess    Plan:   Orders Placed This Encounter    ED/UC REFERRAL TO DERMATOLOGY       No results found for this or any previous visit (from the past 34 hour(s)).      Final Diagnosis    ICD-10-CM ICD-9-CM   1. Actinic keratosis of scalp L57.0 702.0       Encourage fluids, encourage rest, good hand hygiene.    Use over the counter medications as discussed.        Please follow up with your physician as below:    Follow-up Information     Follow up with Bea Graff, MD.    Specialties:  Primary Care, Internal Medicine    Why:  As needed    Contact information:    [place] [address]  [phone]          Thank you [name] for coming to [facility] for your health care concerns.    If your condition changes and/or worsens please follow up with her primary doctor and/or return to the urgent care center.    If short of breath, chest pains or any other concerns please report to the emergency room.    In the event of an Emergency dial 911.      Final Diagnosis  Final diagnoses:   [L57.0] Actinic keratosis of scalp (Primary)           West Pugh, MD       West Pugh, MD  07/07/15 2044

## 2015-07-09 ENCOUNTER — Encounter: Payer: Self-pay | Admitting: Optometry

## 2015-07-09 ENCOUNTER — Ambulatory Visit: Payer: Self-pay | Admitting: Optometry

## 2015-07-09 ENCOUNTER — Telehealth: Payer: Self-pay

## 2015-07-09 VITALS — BP 143/97 | HR 82 | Ht 68.0 in | Wt 235.0 lb

## 2015-07-09 DIAGNOSIS — H5213 Myopia, bilateral: Secondary | ICD-10-CM

## 2015-07-09 DIAGNOSIS — H524 Presbyopia: Secondary | ICD-10-CM

## 2015-07-09 DIAGNOSIS — H52223 Regular astigmatism, bilateral: Secondary | ICD-10-CM

## 2015-07-09 NOTE — Progress Notes (Signed)
Assessment:       1. Myopia, bilateral    2. Regular astigmatism, bilateral    3. Presbyopi           Plan:     1.  Ronald Hughes understands that, in the absence of a functional disposable toric-multifocal, his bes option is distance vision contact lenses with OTC readers.  We have dispensed a trial pair of Biofinity torics OU, with which he'll use +1.00 OTC readers prn.    2.  Consider fitting into new Biofinity toric-multifocal when available in 6-12 months.    3.  CEE in 2/18 as scheduled or sooner if symptoms warrant.

## 2015-07-09 NOTE — Telephone Encounter (Signed)
Patient is being referred through urgent care for an urgent appointment. He can be reached at [phone]

## 2015-07-09 NOTE — Telephone Encounter (Signed)
Patient is calling back to say that he would like to take the 5/24 appointment with [facility] at 11:15.

## 2015-07-09 NOTE — Telephone Encounter (Signed)
All set for Wednesday 08/08/15 at 11:15am with Dr. Joie Bimler at [facility].

## 2015-07-12 ENCOUNTER — Telehealth: Payer: Self-pay

## 2015-07-12 NOTE — Telephone Encounter (Signed)
The patient called in requesting the results from his sleep study that was done 06/28/2015.  The patient can be reached at [phone]

## 2015-07-12 NOTE — Telephone Encounter (Signed)
Called the pt about his request for his sleep study- pt said he does not need it anymore

## 2015-07-12 NOTE — Telephone Encounter (Signed)
Patient stated he didn't get results and asked that we send again.

## 2015-07-16 ENCOUNTER — Telehealth: Payer: Self-pay | Admitting: Sleep Medicine

## 2015-07-16 NOTE — Telephone Encounter (Signed)
CONFIRMED 07/18/15 APPT WITH PATIENT

## 2015-07-18 ENCOUNTER — Ambulatory Visit: Payer: Self-pay | Admitting: Sleep Medicine

## 2015-07-18 ENCOUNTER — Telehealth: Payer: Self-pay

## 2015-07-18 ENCOUNTER — Encounter: Payer: Self-pay | Admitting: Sleep Medicine

## 2015-07-18 VITALS — Ht 68.0 in | Wt 239.0 lb

## 2015-07-18 DIAGNOSIS — G4733 Obstructive sleep apnea (adult) (pediatric): Secondary | ICD-10-CM

## 2015-07-18 MED ORDER — AUTO-TITRATING CPAP MACHINE *A*
0 refills | Status: AC
Start: 2015-07-18 — End: ?

## 2015-07-18 NOTE — Telephone Encounter (Signed)
[facility] ED/UC Post-Visit Patient Call     Visit location: [facility]  Date of service: 07/07/2015     Condition of patient: NA    Patient seeking follow-up with PCP or specialist: NA    Prescriptions filled: n/a    Did ED/UC staff take the time to listen to patient concerns: n/a    Did ED/UC staff keep the patient informed: n/a    Other questions/concerns voiced by patient: PAST 5 DAY [place] to assist patient with instructions: n/a    Britt Boozer on 07/18/2015 at 10:04 AM

## 2015-07-18 NOTE — Telephone Encounter (Signed)
Faxed script for- S10 AutoCPAP with min pres of 5 cm-max pres of 18 cm. Dispense with humidifier,heated tubing, & all required equipment. Duration: lifetime- and records to [facility]

## 2015-07-18 NOTE — Progress Notes (Signed)
[facility] - Follow Up Visit    Dear Dr. Lorna Dibble,      Today I followed up with [name] at the [facility]. He comes to the office today in follow up of a  nocturnal, unattended home sleep test completed by the [facility] on June 28, 2015.    This study utilized a 6-channel Type III home sleep respiratory recorder. The following parameters were recorded for a duration of 363 minutes: snoring (high frequency vibrations in airflow), oronasal pressure airflow, RIP chest effort, SpO2, pulse rate, and body position. There was no EEG monitoring during this study, as such we were not able to discern between periods of sleep and wake.     This study is suggestive of a diagnosis of severe obstructive sleep apnea. His unattended apnea hypopnea index (uAHI) was 59.8 respiratory events per hour of recording time. His oxygen saturation nadir was 72%.Marland Kitchen      VITALS:     Ht 1.727 m (5\' 8" )  Wt 108.4 kg (239 lb)  BMI 36.34 kg/m2    MEDICATIONS:     Current Outpatient Prescriptions   Medication Sig    Cholecalciferol (VITAMIN D PO) Take by mouth    losartan (COZAAR) 25 MG tablet Take 1 tablet (25 mg total) by mouth daily    methylphenidate (RITALIN LA) 30 MG 24 hr capsule Take 1 capsule (30 mg total) by mouth daily (before breakfast)   Max daily dose: 30 mg MDD 40 mg  Code B    fenofibrate (TRICOR) 48 MG tablet TAKE 1 TABLET (48 MG TOTAL) BY MOUTH DAILY    escitalopram (LEXAPRO) 20 MG tablet Take 1 tablet (20 mg total) by mouth daily    Multiple Vitamin (MULTIVITAMIN) per tablet Take 1 tablet by mouth daily     No current facility-administered medications for this visit.        IMPRESSION AND PLAN:     Severe Obstructive Sleep Apnea (G47.33):    Ronald Hughes is a 50 year old man who presents today in follow up of a recent home sleep study. The study demonstrated the presence of severe obstructive sleep apnea.    We reviewed the results of his sleep study in detail. He expressed  understanding of the findings. We discussed potential treatment options including weight loss, mandibular advancement device, palatal surgery and a trial of nasal CPAP.  At this time he will proceed with a trial of CPAP.  We will arrange for him to begin using an auto titrating CPAP device. We will plan to followup in the office approximately 4-6 weeks after he begins using home CPAP to check his compliance and response to therapy.    Thanks for allowing me to participate in this patient's care.  Please feel free to contact me if you have any questions.       Sincerely,   Larene Pickett, MD  [facility]     *This note was dictated using Dragon Medical 360 Network Edition Version 12.51, reasonable efforts were made to correct for any dictation errors.

## 2015-07-18 NOTE — Telephone Encounter (Signed)
[facility]         CPAP HomeCare Authorization    Patient Information:    Ronald Hughes  [birth year]  [address]  [place] [address]  [phone] (home) [phone] (work)    CPAP Vendor:  [facility] - [phone])    Titration Date:   10/31/2015    Follow Up Appt:     Type:   HOME    [facility]  [id number]      Eula Listen

## 2015-07-18 NOTE — Telephone Encounter (Signed)
[facility] ED/UC Post-Visit Patient Call     Visit location: [facility]  Date of service: 07/07/2015    Condition of patient: NA    Patient seeking follow-up with PCP or specialist: NA    Prescriptions filled: n/a    Did ED/UC staff take the time to listen to patient concerns: n/a    Did ED/UC staff keep the patient informed: n/a    Other questions/concerns voiced by patient: PAST 5 DAY [place] to assist patient with instructions: n/a    Britt Boozer on 07/18/2015 at 11:51 AM

## 2015-07-24 ENCOUNTER — Other Ambulatory Visit: Payer: Self-pay | Admitting: Primary Care

## 2015-07-24 MED ORDER — METHYLPHENIDATE HCL 30 MG PO CP24 *A*
30.0000 mg | ORAL_CAPSULE | Freq: Every day | ORAL | 0 refills | Status: DC
Start: 2015-07-24 — End: 2015-10-18

## 2015-07-30 ENCOUNTER — Ambulatory Visit
Admission: RE | Admit: 2015-07-30 | Discharge: 2015-07-30 | Disposition: A | Discharge status: Home / Self Care | Payer: Self-pay | Attending: Gastroenterology | Admitting: Gastroenterology

## 2015-07-30 ENCOUNTER — Encounter: Payer: Self-pay | Admitting: Gastroenterology

## 2015-07-30 HISTORY — DX: Depression, unspecified: F32.A

## 2015-07-30 HISTORY — DX: Sleep apnea, unspecified: G47.30

## 2015-07-30 HISTORY — DX: Essential (primary) hypertension: I10

## 2015-07-30 LAB — HM COLONOSCOPY

## 2015-07-30 MED ORDER — SODIUM CHLORIDE 0.9 % IV SOLN WRAPPED *I*
100.0000 mL/h | Status: DC
Start: 2015-07-30 — End: 2015-07-31

## 2015-07-30 MED ORDER — MIDAZOLAM HCL 5 MG/5ML IJ SOLN *I*
INTRAMUSCULAR | Status: AC | PRN
Start: 2015-07-30 — End: 2015-07-30
  Administered 2015-07-30: 1 mg via INTRAVENOUS
  Administered 2015-07-30 (×2): 2 mg via INTRAVENOUS

## 2015-07-30 MED ORDER — EPINEPHRINE HCL 0.1 MG/ML IJ SOLUTION *WRAPPED*
INTRAMUSCULAR | Status: AC
Start: 2015-07-30 — End: 2015-07-30
  Filled 2015-07-30: qty 10

## 2015-07-30 MED ORDER — MEPERIDINE HCL 50 MG/ML IJ SOLN *WRAPPED*
INTRAMUSCULAR | Status: AC
Start: 2015-07-30 — End: 2015-07-30
  Filled 2015-07-30: qty 2

## 2015-07-30 MED ORDER — MEPERIDINE HCL 50 MG/ML IJ SOLN *WRAPPED*
INTRAMUSCULAR | Status: AC | PRN
Start: 2015-07-30 — End: 2015-07-30
  Administered 2015-07-30: 25 mg via INTRAVENOUS
  Administered 2015-07-30: 50 mg via INTRAVENOUS

## 2015-07-30 MED ORDER — EPINEPHRINE HCL 0.1 MG/ML IJ SOLUTION *WRAPPED*
INTRAMUSCULAR | Status: DC | PRN
Start: 2015-07-30 — End: 2015-07-31
  Administered 2015-07-30: .02 mg via SUBMUCOSAL

## 2015-07-30 MED ORDER — MIDAZOLAM HCL 5 MG/5ML IJ SOLN *I*
INTRAMUSCULAR | Status: AC
Start: 2015-07-30 — End: 2015-07-30
  Filled 2015-07-30: qty 10

## 2015-07-30 NOTE — Procedures (Addendum)
Colonoscopy Procedure Note   Date of Procedure: 07/30/15  Referring Physician: Bea Graff, MD  Primary Physician: Bea Graff, MD  Attending Physician: Roanna Raider, MD  Fellow:   Renato Gails, MD  Indications:Colorectal cancer screening-average risk  Previous colonoscopy: No  Medications: Versed 5mg , Demerol 75mg  were administered incrementally over the course of the procedure to achieve an adequate level of conscious sedation.    Moderate Sedation Face Times  Start Time: 1331  End Time: 1401  Duration (minutes): 30 Minutes   Assessment, ordering, supervision and monitoring of moderate sedation was performed by the endoscopy attending throughout the case from the first dose of sedation until patient left procedural area.  I was present throughout the entire moderate sedation.  Roanna Raider, MD      ScopesBX:[id number]    Procedure Details: Full disclosures of risks were reviewed with patient as detailed on the consent form. The patient was placed in the left lateral decubitus position and monitored with continuous pulse oximetry, interval blood pressure monitoring and direct observations. After anorectal examination was performed, the colonoscope was inserted into the rectum and advanced under direct vision to the terminal ileum. The cecum was fully inflated, allowing a complete view, including the medial wall between the IC valve and appendiceal orifice.     During withdrawal examination, the final quality of the prep was [facility] Bowel Prep Scale:    Right Colon: Grade3- (entire mucosa of colon segment seen well, with no residual staining, small fragments of stool, or opaque liquid)  Transverse Colon: Grade 3- (entire mucosa of colon segment seen well, with no residual staining, small fragments of stool, or opaque liquid)  Left Colon: Grade 3- (entire mucosa of colon segment seen well, with no residual staining, small fragments of stool, or opaque liquid)    A careful inspection was made as the  colonoscope was withdrawn. A retroflexed view of the rectum was performed; findings and interventions are described below. The procedure was considered not difficult. The patient was recovered in the GI recovery area.     Scope Times:     Insertion Time: 1336  Cecum Time: [id number]     Withdrawal Time: 17 minutes    Findings:  Normal perineum and anus on DRE with small perianal skin tag seen.  Upon insertion, at 68cm from anal verge, a 52mm sessile polyp was found and removed with cold snare polypectomy. Scope passed to Cecum and into  TI for 5 cm. Well prepped.   Full view of distended caput cecum including medial wall from valve to Appendiceal Orifice was obtained, and a 93mm sessile polyp was found and removed with cold snare polypectomy. A right colon retroflexion were obtained and photo documented.  Upon withdrawal in R. Colon, a 58mm sessile polyp was found and removed by cold snare polypectomy.  In the rectum, at 10cm from anal verge, a 2cm pedunculated polyp with stalk was found.  The base was injected with epinephrine -1cc & the polyp was removed with hot snare cautery at 20W. Excellent hemostasis with clean polyp base. After hot snare polypectomy, a 2-71mm sessile polyp was found 1cm proximal to the previous 2cm polyp base in recturm. This was removed with cold snare polypectomy.    No other polyps. No diverticulosis.  Rectal retroflexion with normal mucosa and small hemorrhoidal skin tag seen on retroflexion.      Intervention(s):    4 polyp(s) removed by cold snare biopsy   1 polyp(s) removed by hot snare biopsy  Complication(s):   none    Impression(s):   Multiple polyps as removed above including 2cm pedunculated rectal polyp.   Otherwise normal colonoscopy to the terminal ileum.    Recommendation(s):   Await pathology.   Repeat colonoscopy in 3 years.(pending biopsies)    FINAL DIAGNOSIS: - repeat colonoscopy in 3 years  (A) Colon, 68 cm, biopsy:   - Tubular adenoma.     (B) Colon, cecum, biopsy:    - Tubular adenoma.     (C) Colon, right, biopsy:   -Sessile serrated polyp/adenoma.     (D) Colon, 10 cm, biopsy:   -Tubulovillous adenoma.   - Tubular adenoma.   - The larger lesion appears to be excised.     Renato Gails MD, PGY-4, GI Fellow       GI ATTENDING  I was present during the entire viewing portion of this endoscopy procedure, including from the moment of scope insertion until the completion of the scope withdrawal.  I agree with the fellow's documentation of the procedure.  Dr. Talitha Givens

## 2015-07-30 NOTE — Preop H&P (Addendum)
OUTPATIENT PRE-PROCEDURE H&P    Chief Complaint / Indications for Procedure:   1st screening Colonoscopy.  denies family history of colorectal cancer       Past Medical History:     Past Medical History:   Diagnosis Date    ADD (attention deficit disorder)     Anxiety     Depression     Dry eye syndrome     Hypertension     Sleep apnea      History reviewed. No pertinent surgical history.  Family History   Problem Relation Age of Onset    Cancer Mother      lung    Arthritis Father     Cancer Father      renal    Dementia Father 92    Cancer Maternal Grandmother      brain    Cancer Maternal Grandfather      lung    No Known Problems Brother     Arthritis Brother      Back, knee    No Known Problems Paternal Grandmother     Parkinsonism Paternal Grandfather     No Known Problems Maternal Aunt     No Known Problems Maternal Uncle     No Known Problems Paternal Aunt     No Known Problems Paternal Uncle     Cataracts Neg Hx     Diabetes Neg Hx     Glaucoma Neg Hx     Macular degeneration Neg Hx      Social History     Social History    Marital status: Married     Spouse name: N/A    Number of children: N/A    Years of education: N/A     Social History Main Topics    Smoking status: Current Every Day Smoker     Types: Cigars    Smokeless tobacco: None      Comment: 1 cigars a day    Alcohol use Yes      Comment: 2-3/ week    Drug use: No    Sexual activity: Not Asked     Other Topics Concern    None     Social History Narrative       Allergies:  No Known Allergies (drug, envir, food or latex)    Medications:    (Not in a hospital admission)    Current Outpatient Prescriptions   Medication    methylphenidate (RITALIN LA) 30 MG 24 hr capsule    losartan (COZAAR) 25 MG tablet    fenofibrate (TRICOR) 48 MG tablet    escitalopram (LEXAPRO) 20 MG tablet    Multiple Vitamin (MULTIVITAMIN) per tablet    auto-titrating CPAP (AUTOSET) machine    Cholecalciferol (VITAMIN D PO)     Current  Facility-Administered Medications   Medication Dose Route Frequency    sodium chloride 0.9 % IV  100 mL/hr Intravenous Continuous     Vitals:    07/30/15 1245   BP: (!) 144/93   Pulse: 76   Resp: 18   Temp: 37 C (98.6 F)   Weight: 106.6 kg (235 lb)   Height: 172.7 cm (5\' 8" )       ROS    Physical Examination:  Head/Nose/Throat:negative  Lungs:Lungs clear  Cardiovascular:normal S1 and S2  Abdomen: abdomen soft, non-tender, nondistended, normal active bowel sounds, no masses or organomegaly      Lab Results: none    Radiology impressions (last 30 days):  No results found.    Currently Active Problems:  Patient Active Problem List   Diagnosis Code    Anxiety F41.9    Depression F32.9    ADD (attention deficit disorder) F98.8    Unspecified essential hypertension I10    Obesity E66.9    OSA (obstructive sleep apnea) G47.33        Impression:  Screening colonoscopy    Plan:  Colonoscopy  Moderate Sedation    UPDATES TO PATIENT'S CONDITION on the DAY OF SURGERY/PROCEDURE    I. Updates to Patient's Condition (to be completed by a provider privileged to complete a H&P, following reassessment of the patient by the provider):    Full H&P done today; no updates needed.    II. Procedure Readiness   I have reviewed the patient's H&P and updated condition. By completing and signing this form, I attest that this patient is ready for surgery/procedure.      III. Attestation   I have reviewed the updated information regarding the patient's condition and it is appropriate to proceed with the planned surgery/procedure.    Scherry Ran., MD as of 1:09 PM 07/30/2015           GI ATTENDING  I saw and evaluated the patient. I agree with the resident's/fellow's findings and plan of care as documented above.    Roanna Raider, MD

## 2015-07-30 NOTE — Discharge Instructions (Signed)
Gastroenterology Unit  Discharge Instructions for Colonoscopy      07/30/2015    2:01 PM    Colonoscopy and Polyp(s) Removed    Do not drive, operate heavy machinery, drink alcoholic beverages, make important personal or business decisions, or sign legal documents until the next day.      Return to your usual diet   Return to taking your usual medications    Things you may expect:   A small amount of bright red blood in your stool   It may be a few days before you have a bowel movement   You may have cramping, bloating, and feelings of "gas". These feelings should go away as you pass gas. If you still feel uncomfortable, walking around will help to pass the gas.   You were given medication to help you relax during the test. You may feel "fuzzy" and drowsy. Go home and rest for at least 4-6 hours.    You should call your doctor for any of the following:   Bad stomach pain   Fever   Bright red bleeding or clots (This may happen up to 20 days after the test.)   Dizziness or weakness that gets worse or lasts up to 24 hours.   Pain or redness at the IV site    If you have a serious problem after hours, Call [phone] to reach the GI physician on call. If you are unable to reach your doctor, go to the [facility] Emergency Department.    Follow Up Care:   If biopsies were taken during your procedure, we will send you the pathology results within 7-10 days. If you do not receive your pathology results after 10 days please call [phone]   Repeat exam in  3 year(s) (most likely, depending upon the final biopsy reading of the polyps we removed)    New Prescriptions    No medications on file

## 2015-07-31 ENCOUNTER — Telehealth: Payer: Self-pay

## 2015-07-31 NOTE — Telephone Encounter (Signed)
Follow up call made to pt but was unable to leave voice message as mailbox was full.

## 2015-08-01 ENCOUNTER — Encounter: Payer: Self-pay | Admitting: Sleep Medicine

## 2015-08-01 LAB — SURGICAL PATHOLOGY

## 2015-08-01 NOTE — Progress Notes (Signed)
Carly please send letter:    Dear Ronald Hughes:  The polyps removed during your colonoscopy were benign, but of the precancerous variety.  Based on your clinical information, and current national guidelines, I recommend that you repeat a colonoscopy once again in three (3) years.  Due to your young age, your siblings and your children should receive colon cancer screening by colonoscopy starting at age 51, and at least every 5 years if they have normal colonoscopies.  Sincerely,  Dr. Talitha Givens

## 2015-08-02 ENCOUNTER — Encounter: Payer: Self-pay | Admitting: Gastroenterology

## 2015-08-02 ENCOUNTER — Telehealth: Payer: Self-pay

## 2015-08-02 NOTE — Telephone Encounter (Signed)
Called the pt and tried to leave a vm but his mailbox was full - the script was sent to [facility] on 05/03/17the # to [facility] is [phone]. I will my chart him to let him know

## 2015-08-08 ENCOUNTER — Ambulatory Visit: Payer: Self-pay | Admitting: Dermatology

## 2015-08-09 ENCOUNTER — Other Ambulatory Visit: Payer: Self-pay | Admitting: Primary Care

## 2015-08-09 NOTE — Telephone Encounter (Signed)
Istop checked

## 2015-08-14 NOTE — Telephone Encounter (Signed)
Patient was set up on: 5.24.2017 with [facility]  Type of equipment CPAP/BIPAP: AirSense 10 Auto    Setting:5-18cm  Mask: DreamWear small w/ chinstrap     CFU needed? 8.16.2017

## 2015-08-17 ENCOUNTER — Telehealth: Payer: Self-pay | Admitting: Primary Care

## 2015-08-17 DIAGNOSIS — Z0000 Encounter for general adult medical examination without abnormal findings: Secondary | ICD-10-CM

## 2015-08-17 NOTE — Telephone Encounter (Signed)
Physical scheduled  August 27, 2015.

## 2015-08-27 ENCOUNTER — Encounter: Payer: Self-pay | Admitting: Primary Care

## 2015-08-29 ENCOUNTER — Ambulatory Visit: Payer: Self-pay | Admitting: Sleep Medicine

## 2015-08-30 ENCOUNTER — Other Ambulatory Visit: Payer: Self-pay | Admitting: Primary Care

## 2015-08-31 ENCOUNTER — Ambulatory Visit: Payer: Self-pay | Admitting: Primary Care

## 2015-08-31 VITALS — BP 112/80 | HR 74 | Temp 98.9°F | Ht 68.0 in | Wt 240.0 lb

## 2015-08-31 DIAGNOSIS — J40 Bronchitis, not specified as acute or chronic: Secondary | ICD-10-CM

## 2015-08-31 MED ORDER — AZITHROMYCIN 250 MG PO TABS *I*
ORAL_TABLET | ORAL | 0 refills | Status: DC
Start: 2015-08-31 — End: 2016-02-22

## 2015-08-31 NOTE — Progress Notes (Signed)
Patient presents today for...  Day 7, started initially with some nasal congestion and a sore throat. Gotten progressively worse, initially discharge was thick but clear, today bright yellow. Feeling more malaise, flu like. Felt like he was having a temp but not getting one. Cough has been rough, horrendous, hard to be productive but feels like it should be. Taking mucinex, sudafed and advil. Was sneezing originally, that is better. No pain with breathing, getting a little wheezing, he has been using his albuterol a couple of times a day. Before this, last need was back in Feb with a bronchitis. Only needs with illness.     Using the CPAP now, that seems to be helping. Couldn't do it a couple of days this week with the congestion and noticed the difference.     SH: Work is OK, finishing up the year, he is on a 26 month contract.       MEDICAL PROBLEMS  Patient Active Problem List   Diagnosis Code    Anxiety F41.9    Depression F32.9    ADD (attention deficit disorder) F98.8    Unspecified essential hypertension I10    Obesity E66.9    OSA (obstructive sleep apnea) G47.33       CURRENT ALLERGIES  Review of patient's allergies indicates no known allergies (drug, envir, food or latex).    CURRENT MEDICATIONS  Current Outpatient Prescriptions   Medication Sig    methylphenidate (RITALIN LA) 30 MG 24 hr capsule Take 1 capsule (30 mg total) by mouth daily (before breakfast)   Max daily dose: 30 mg MDD 40 mg  Code B    auto-titrating CPAP (AUTOSET) machine S10 AutoCPAP with min pres of 5 cm-max pres of 18 cm. Dispense with humidifier,heated tubing, & all required equipment. Duration: lifetime    Cholecalciferol (VITAMIN D PO) Take by mouth    losartan (COZAAR) 25 MG tablet Take 1 tablet (25 mg total) by mouth daily    fenofibrate (TRICOR) 48 MG tablet TAKE 1 TABLET (48 MG TOTAL) BY MOUTH DAILY    escitalopram (LEXAPRO) 20 MG tablet Take 1 tablet (20 mg total) by mouth daily    Multiple Vitamin (MULTIVITAMIN)  per tablet Take 1 tablet by mouth daily     No current facility-administered medications for this visit.        The patients medications and allergies were reviewed, reconciled as needed and no changes were made.    EXAMINATION  Vitals:    08/31/15 1151   BP: 112/80   Pulse: 74   Temp: 37.2 C (98.9 F)   SpO2: 97%   Weight: 108.9 kg (240 lb)   Height: 1.727 m (5\' 8" )     BP Readings from Last 4 Encounters:   08/31/15 112/80   07/30/15 120/80   07/09/15 (!) 143/97   07/07/15 (!) 168/98     Last weight   08/31/15 108.9 kg (240 lb)   07/30/15 106.6 kg (235 lb)   07/18/15 108.4 kg (239 lb)   07/09/15 106.6 kg (235 lb)     Body mass index is 36.49 kg/(m^2).  Last 4 Weights    08/31/15 1151   Weight: 108.9 kg (240 lb)     He feels warm to me a temperature recorded above is normal.  Ears show clear canals and nl TMs, without retraction or redness  OP is clear, no exudate  LN are neg in the anterior cervical chain, anterior and posterior ear.  Sinus shows no tenderness  to palpation. Nasal passages show clear secretions  Lungs are symmetrically resonant on percussion,Has some slight wheezes bilaterally posteriorly but fairly prominent anterior wheezes.  Breath sounds are symmetric.  No obvious rales.  Card exam shows a rrr, no S3.      IMPRESSION/PLAN   Bronchitis-with a mild asthma flare.  He'll continue his albuterol but advised him to move up to 4 times a day.  Might be a little early to consider antibiotics but with the feelings of fever and change in phlegm color 7 days into the illness we decided to go ahead and cover for possible secondary bacterial process.  Started azithromycin.

## 2015-09-27 ENCOUNTER — Encounter: Payer: Self-pay | Admitting: Primary Care

## 2015-10-09 ENCOUNTER — Telehealth: Payer: Self-pay | Admitting: Primary Care

## 2015-10-09 MED ORDER — NICOTINE 14 MG/24HR TD PT24 *I*
1.0000 | MEDICATED_PATCH | Freq: Every day | TRANSDERMAL | 0 refills | Status: DC
Start: 2015-10-09 — End: 2016-02-22

## 2015-10-09 NOTE — Telephone Encounter (Signed)
Spoke to patient, told him ok for starting dose would be 14, sent that in, do one a day for a month, after that call back back and he can send in the step down (7)

## 2015-10-09 NOTE — Telephone Encounter (Signed)
i'll send in a script, dosed based on how many cigarettes a day, how much does he smoke?

## 2015-10-09 NOTE — Telephone Encounter (Signed)
He is interested in trying a nicotine patches to stop smoking.  Do you need to see him for an appointment or can you send in a script?  Uses [facility].  Please advise.

## 2015-10-09 NOTE — Telephone Encounter (Signed)
He smokes less 10 a day.  He said thank you.

## 2015-10-09 NOTE — Telephone Encounter (Signed)
OK, starting dose would be 14, sending that in, do one a day for a month, after that call back and I can send in the step down (7)

## 2015-10-18 ENCOUNTER — Other Ambulatory Visit: Payer: Self-pay | Admitting: Primary Care

## 2015-10-18 MED ORDER — METHYLPHENIDATE HCL 30 MG PO CP24 *A*
30.0000 mg | ORAL_CAPSULE | Freq: Every day | ORAL | 0 refills | Status: DC
Start: 2015-10-18 — End: 2016-01-21

## 2015-10-19 ENCOUNTER — Other Ambulatory Visit: Payer: Self-pay | Admitting: Primary Care

## 2015-10-19 NOTE — Telephone Encounter (Signed)
UTD Appt.

## 2015-10-29 ENCOUNTER — Telehealth: Payer: Self-pay | Admitting: Sleep Medicine

## 2015-10-29 NOTE — Telephone Encounter (Signed)
Left vm to confirm 10/31/15 appt

## 2015-10-31 ENCOUNTER — Encounter: Payer: Self-pay | Admitting: Sleep Medicine

## 2015-10-31 ENCOUNTER — Ambulatory Visit: Payer: Self-pay | Admitting: Sleep Medicine

## 2015-10-31 VITALS — Ht 68.0 in | Wt 239.0 lb

## 2015-10-31 DIAGNOSIS — G4733 Obstructive sleep apnea (adult) (pediatric): Secondary | ICD-10-CM

## 2015-10-31 NOTE — Progress Notes (Signed)
[facility] - Follow Up/CPAP Compliance Visit    Dear Dr. Lorna Dibble,      This morning I followed up with Ronald Hughes at the [facility]. He comes to the office today in follow up of CPAP therapy. Since beginning CPAP he reports improvement in daytime sleepiness/energy level, snoring, and sleep continuity.  Overall, he notes a considerable improvement in his sleep quality and daytime energy level with the use of cpap.     We interrogated his CPAP unit, a ResMed S10 Autoset, set at 5-18 cm of H20. His median pressure need has been 8.7 cm of H20. He has exceeded the insurance carrier requirement of  4+ hours/night of use ? 70% of the nights in 30 consecutive days for continued coverage for PAP therapy. His average nightly usage has been 5 hours and 43 minutes per night.  His estimated, residual apnea hypopnea index (AHI) was 1.6 respiratory events per hour of sleep, consistent with effective therapy.  His estimated, median leak was 0.7 liters per minute, within the acceptable range.  This past month his cpap compliance has been a little bit less consistent, as he will intermittently follow sleep before putting on his cpap.    VITALS:  Ht 1.727 m (5\' 8" )  Wt 108.4 kg (239 lb)  BMI 36.34 kg/m2    MEDICATIONS:  Current Outpatient Prescriptions   Medication Sig    losartan (COZAAR) 25 MG tablet TAKE 1 TABLET (25 MG TOTAL) BY MOUTH DAILY    methylphenidate (RITALIN LA) 30 MG 24 hr capsule Take 1 capsule (30 mg total) by mouth daily (before breakfast)   Max daily dose: 30 mg MDD 40 mg  Code B    auto-titrating CPAP (AUTOSET) machine S10 AutoCPAP with min pres of 5 cm-max pres of 18 cm. Dispense with humidifier,heated tubing, & all required equipment. Duration: lifetime    fenofibrate (TRICOR) 48 MG tablet TAKE 1 TABLET (48 MG TOTAL) BY MOUTH DAILY    escitalopram (LEXAPRO) 20 MG tablet Take 1 tablet (20 mg total) by mouth daily    Multiple Vitamin (MULTIVITAMIN) per tablet Take 1 tablet by  mouth daily    nicotine (NICODERM CQ) 14 MG/24HR patch Place 1 patch onto the skin daily   Remove & discard patch after 24 hours.    azithromycin (ZITHROMAX) 250 MG tablet Take two tablets daily on day 1, then one tablet daily days 2-5    Cholecalciferol (VITAMIN D PO) Take by mouth     No current facility-administered medications for this visit.        IMPRESSION & PLAN:     SEVERE OBSTRUCTIVE SLEEP APNEA (compliant with CPAP therapy):    Ronald Hughes is a 50 year old man who presents today for follow-up of severe obstructive sleep apnea.  He is now been using home cpap therapy for the past couple of months.  During this timeframe he has developed good compliance with cpap, exceeding standard insurance requirements.  He has noted a clear clinical benefit.  Interrogation of his cpap equipment confirms effective settings and compliance.    -I reviewed strategies to help maximize his daily compliance with cpap.  -We will plan to follow-up in 12 months, or sooner if needed.    Again, thank you for the opportunity to participate in the care of your patients. Please do not hesitate to contact our office with any questions.    Sincerely,  Larene Pickett, MD  [facility]

## 2015-11-06 NOTE — Telephone Encounter (Signed)
CFU note faxed to [facility].

## 2015-11-07 ENCOUNTER — Other Ambulatory Visit: Payer: Self-pay | Admitting: Primary Care

## 2015-11-07 MED ORDER — ESCITALOPRAM OXALATE 20 MG PO TABS *I*
20.0000 mg | ORAL_TABLET | Freq: Every day | ORAL | 3 refills | Status: DC
Start: 2015-11-07 — End: 2016-11-18

## 2015-11-24 ENCOUNTER — Other Ambulatory Visit: Payer: Self-pay | Admitting: Primary Care

## 2015-12-01 ENCOUNTER — Encounter: Payer: Self-pay | Admitting: Primary Care

## 2015-12-03 MED ORDER — TADALAFIL 10 MG PO TABS *I*
10.0000 mg | ORAL_TABLET | Freq: Every day | ORAL | 5 refills | Status: DC | PRN
Start: 2015-12-03 — End: 2016-02-22

## 2015-12-11 ENCOUNTER — Telehealth: Payer: Self-pay | Admitting: Primary Care

## 2015-12-11 NOTE — Telephone Encounter (Signed)
His Cialis request does need a prior authorization.  Please advise.

## 2015-12-12 ENCOUNTER — Encounter: Payer: Self-pay | Admitting: Primary Care

## 2015-12-12 DIAGNOSIS — N529 Male erectile dysfunction, unspecified: Secondary | ICD-10-CM | POA: Insufficient documentation

## 2015-12-12 NOTE — Telephone Encounter (Signed)
Questions answered on printout.

## 2015-12-12 NOTE — Telephone Encounter (Signed)
Will finish on line with covermymeds.

## 2016-01-21 ENCOUNTER — Other Ambulatory Visit: Payer: Self-pay | Admitting: Primary Care

## 2016-01-21 MED ORDER — METHYLPHENIDATE HCL 30 MG PO CP24 *A*
30.0000 mg | ORAL_CAPSULE | Freq: Every day | ORAL | 0 refills | Status: DC
Start: 2016-01-21 — End: 2016-05-26

## 2016-01-30 ENCOUNTER — Telehealth: Payer: Self-pay | Admitting: Primary Care

## 2016-01-30 NOTE — Telephone Encounter (Signed)
[employer] has denied the request for cialis 10 mg tablet

## 2016-02-06 ENCOUNTER — Other Ambulatory Visit
Admission: RE | Admit: 2016-02-06 | Discharge: 2016-02-06 | Disposition: A | Discharge status: Home / Self Care | Payer: Self-pay | Source: Ambulatory Visit | Attending: Primary Care | Admitting: Primary Care

## 2016-02-06 DIAGNOSIS — Z0000 Encounter for general adult medical examination without abnormal findings: Secondary | ICD-10-CM

## 2016-02-06 LAB — CBC AND DIFFERENTIAL
Baso # K/uL: 0 10*3/uL (ref 0.0–0.1)
Basophil %: 0.5 %
Eos # K/uL: 0.1 10*3/uL (ref 0.0–0.5)
Eosinophil %: 1.6 %
Hematocrit: 44 % (ref 40–51)
Hemoglobin: 14.8 g/dL (ref 13.7–17.5)
IMM Granulocytes #: 0 10*3/uL (ref 0.0–0.1)
IMM Granulocytes: 0.3 %
Lymph # K/uL: 1.6 10*3/uL (ref 1.3–3.6)
Lymphocyte %: 25.3 %
MCH: 29 pg/cell (ref 26–32)
MCHC: 33 g/dL (ref 32–37)
MCV: 87 fL (ref 79–92)
Mono # K/uL: 0.7 10*3/uL (ref 0.3–0.8)
Monocyte %: 11.4 %
Neut # K/uL: 3.8 10*3/uL (ref 1.8–5.4)
Nucl RBC # K/uL: 0 10*3/uL (ref 0.0–0.0)
Nucl RBC %: 0 /100 WBC (ref 0.0–0.2)
Platelets: 181 10*3/uL (ref 150–330)
RBC: 5.1 MIL/uL (ref 4.6–6.1)
RDW: 11.9 % (ref 11.6–14.4)
Seg Neut %: 60.9 %
WBC: 6.3 10*3/uL (ref 4.2–9.1)

## 2016-02-06 LAB — BASIC METABOLIC PANEL
Anion Gap: 16 (ref 7–16)
CO2: 24 mmol/L (ref 20–28)
Calcium: 9.9 mg/dL (ref 8.6–10.2)
Chloride: 103 mmol/L (ref 96–108)
Creatinine: 1.16 mg/dL (ref 0.67–1.17)
GFR,Black: 84 *
GFR,Caucasian: 72 *
Glucose: 100 mg/dL — ABNORMAL HIGH (ref 60–99)
Lab: 21 mg/dL — ABNORMAL HIGH (ref 6–20)
Potassium: 4.9 mmol/L (ref 3.3–5.1)
Sodium: 143 mmol/L (ref 133–145)

## 2016-02-06 LAB — LIPID PANEL
Chol/HDL Ratio: 4.1
Cholesterol: 214 mg/dL — AB
HDL: 52 mg/dL
LDL Calculated: 116 mg/dL
Non HDL Cholesterol: 162 mg/dL
Triglycerides: 229 mg/dL — AB

## 2016-02-06 LAB — PSA (EFF.4-2010): PSA (eff. 4-2010): 0.3 ng/mL (ref 0.00–4.00)

## 2016-02-22 ENCOUNTER — Ambulatory Visit: Payer: Self-pay | Admitting: Primary Care

## 2016-02-22 VITALS — BP 140/98 | HR 86 | Ht 68.0 in | Wt 244.0 lb

## 2016-02-22 DIAGNOSIS — Z23 Encounter for immunization: Secondary | ICD-10-CM

## 2016-02-22 DIAGNOSIS — I10 Essential (primary) hypertension: Secondary | ICD-10-CM

## 2016-02-22 DIAGNOSIS — Z0000 Encounter for general adult medical examination without abnormal findings: Secondary | ICD-10-CM

## 2016-02-22 MED ORDER — LOSARTAN POTASSIUM 50 MG PO TABS *I*
50.0000 mg | ORAL_TABLET | Freq: Every day | ORAL | 3 refills | Status: DC
Start: 2016-02-22 — End: 2017-02-21

## 2016-02-22 MED ORDER — TADALAFIL 10 MG PO TABS *I*
10.0000 mg | ORAL_TABLET | Freq: Every day | ORAL | 5 refills | Status: DC | PRN
Start: 2016-02-22 — End: 2016-05-26

## 2016-02-22 NOTE — Progress Notes (Signed)
Patient presents today for...  Here for a PE  1. Sleep apnea-he is occasionally not putting it on, falling asleep before putting it on and then waking up at 3 and finally putting it on. He was more disciplined in the past, he does notice that he feels a little more tired when he does that. Average use was 5 hours and 43 min when checked in aug.  2. HTn-high today, when he checks around 130/85. Taking the losartan, no lightheadedness.   3. ADD-on the methylphenidate daily. No side effects, he missed a dose the other day but he noticed the lack of focus, all over the place, foggy, irritable.   4. Mood-he feels he's been good. More a combo of anxiety and depression. He's had some weeks with anxiety and some weeks with mood, but nothing significant.   5. ED-not getting full erections, not lasting long enough. Prescribed it, but got denied. He felt he got stuffy when he tried a 10 mg sample. He was doing a 5 mg dose. No BPH issues, no nocturia.     Quit smoking and gained 10 pounds. Did it with the alan carr book.    He is going to the gym, averaging 1-2 days a week, cardio and weights. Does walk the dog daily. Pretty active. He feels portions are bigger than they need to be, generally eats well except the evening.     Review of Systems   Constitutional: Negative.    HENT: Negative.    Eyes: Negative.    Respiratory: Negative.    Cardiovascular: Negative.    Gastrointestinal: Negative.    Genitourinary: Negative.    Musculoskeletal: Negative.    Skin: Negative.    Neurological: Negative.    Endo/Heme/Allergies: Negative.    Psychiatric/Behavioral: Negative.        MEDICAL PROBLEMS  Patient Active Problem List   Diagnosis Code    Anxiety F41.9    Depression F32.9    ADD (attention deficit disorder) F98.8    Unspecified essential hypertension I10    Obesity E66.9    OSA (obstructive sleep apnea) G47.33    Erectile dysfunction N52.9       CURRENT ALLERGIES  Review of patient's allergies indicates no known allergies  (drug, envir, food or latex).    CURRENT MEDICATIONS  Current Outpatient Prescriptions   Medication Sig    methylphenidate (RITALIN LA) 30 MG 24 hr capsule Take 1 capsule (30 mg total) by mouth daily (before breakfast)   Max daily dose: 30 mg MDD 40 mg  Code B    losartan (COZAAR) 25 MG tablet TAKE 1 TABLET (25 MG TOTAL) BY MOUTH DAILY    escitalopram (LEXAPRO) 20 MG tablet Take 1 tablet (20 mg total) by mouth daily    auto-titrating CPAP (AUTOSET) machine S10 AutoCPAP with min pres of 5 cm-max pres of 18 cm. Dispense with humidifier,heated tubing, & all required equipment. Duration: lifetime    fenofibrate (TRICOR) 48 MG tablet TAKE 1 TABLET (48 MG TOTAL) BY MOUTH DAILY     No current facility-administered medications for this visit.        The patients medications and allergies were reviewed, reconciled as needed and no changes were made.    EXAMINATION  Vitals:    02/22/16 1003   BP: (!) 140/98   Pulse: 86   Weight: 110.7 kg (244 lb)   Height: 1.727 m (5\' 8" )     BP Readings from Last 4 Encounters:   02/22/16 (!) 140/98  08/31/15 112/80   07/30/15 120/80   07/09/15 (!) 143/97     Last weight   02/22/16 110.7 kg (244 lb)   10/31/15 108.4 kg (239 lb)   08/31/15 108.9 kg (240 lb)   07/30/15 106.6 kg (235 lb)     Body mass index is 37.1 kg/(m^2).  Last 4 Weights    02/22/16 1003   Weight: 110.7 kg (244 lb)     Physical Exam   Constitutional: He appears well-developed and well-nourished.   HENT:   Head: Normocephalic and atraumatic.   Right Ear: External ear normal.   Left Ear: External ear normal.   Nose: Nose normal.   Mouth/Throat: Oropharynx is clear and moist.   Eyes: Conjunctivae and EOM are normal. Pupils are equal, round, and reactive to light. Right eye exhibits no discharge. Left eye exhibits no discharge.   Fundoscopic exam:       The right eye shows no AV nicking and no papilledema.        The left eye shows no AV nicking and no papilledema.   Neck: Normal range of motion. Neck supple. No tracheal  deviation present. No thyromegaly present.   Cardiovascular: Normal rate, regular rhythm, normal heart sounds and intact distal pulses.  Exam reveals no gallop.    No murmur heard.  Pulmonary/Chest: Effort normal and breath sounds normal. No respiratory distress. He has no wheezes. He has no rales. He exhibits no tenderness.   Abdominal: Soft. Bowel sounds are normal. He exhibits no distension and no mass. There is no hepatosplenomegaly. There is no tenderness. There is no rebound and no guarding. Hernia confirmed negative in the right inguinal area and confirmed negative in the left inguinal area.   Genitourinary: Testes normal and penis normal. Right testis shows no mass. Left testis shows no mass.   Musculoskeletal: Normal range of motion. He exhibits no edema or tenderness.   Lymphadenopathy:        Head (right side): No submental and no submandibular adenopathy present.        Head (left side): No submental and no submandibular adenopathy present.     He has no cervical adenopathy.     He has no axillary adenopathy.        Right: No inguinal adenopathy present.        Left: No inguinal adenopathy present.   Neurological: He is alert. He has normal reflexes. No cranial nerve deficit. Coordination normal.   Skin: Skin is warm and dry. No rash noted. No erythema. No pallor.   Psychiatric: He has a normal mood and affect. His behavior is normal. Thought content normal.           IMPRESSION/PLAN   1.  ADD-he feels like he's doing well with his medication.  Seems to be controlling symptoms without significant side effects.  We'll continue with it.  No change to dose.  2.  Mood-actually might be doing a little bit better here fairly stable for a while.  We discussed the possibility of considering cutting back on the dose but will wait till spring.  Recommended to try 10 mg a day for a month if he does this and his spouse reports an increase anxiety or mood issues or he notices that we'll go back.  If he tolerates that  after a month consider coming off of it.  3.  Hypertension-not quite to goal therefore will increase the losartan to 50 mg.  EKG done today due to hypertension showed  a normal sinus rhythm with normal axis and no ST changes.  4.  ED-likely multifactorial could be some affective medications but we can't really just those.  We might see how he does in the spring and consider dropping on further thorax increasing the blood pressure medication due to poor control.  No drop libido or suggestion of low testosterone levels.  Therefore recommended he try one of the PDE medications and prescribed him Cialis.  He recalls having some samples in the past and he found a low dose of 5 mg on an as-needed basis was sufficient.  5.  Hyperlipidemia-not quite to goal but I think is close enough that I wouldn't recommend doing dual medications.  Feel he needs the fenofibrate because without that medication triglycerides are up in the 700 range.    Reviewed safety issues, re seatbelts, helmets, trauma prevention.  Alcohol use reviewed, recommendations discusssed  Colonoscopy recommendations for colon cancer screening reviewed.   Reviewed skin cancer risk/prevention.  Reviewed self testicular examination.  Reviewed current issues and debate with prostate cancer screening.   Diet and exercise recommendations reviewed.  Immunizations brought up to date.   HCP reviewed and blank supplied.

## 2016-02-29 ENCOUNTER — Telehealth: Payer: Self-pay | Admitting: Primary Care

## 2016-02-29 NOTE — Telephone Encounter (Signed)
Patient is calling to say he got a call from the pharmacy and to ask [place] to resubmit the request.

## 2016-02-29 NOTE — Telephone Encounter (Signed)
Call from pharmacy regarding prior auth for this patients cialis?  Looks as it was denied in Nov has this been resubmitted?

## 2016-02-29 NOTE — Telephone Encounter (Signed)
He wanted to try again, he hadn't had an office visit regarding the med in Nov and was hoping that now that he has had one it might be apporved.

## 2016-02-29 NOTE — Telephone Encounter (Signed)
Sent note asking to send the PA on again.

## 2016-03-03 ENCOUNTER — Telehealth: Payer: Self-pay | Admitting: Primary Care

## 2016-03-03 MED ORDER — SILDENAFIL CITRATE 50 MG PO TABS *I*
50.0000 mg | ORAL_TABLET | Freq: Every day | ORAL | 5 refills | Status: DC | PRN
Start: 2016-03-03 — End: 2016-05-26

## 2016-03-03 NOTE — Telephone Encounter (Signed)
Call and let him know, we'll send in viagra.

## 2016-03-03 NOTE — Telephone Encounter (Signed)
I left a message to call the office.

## 2016-03-03 NOTE — Telephone Encounter (Signed)
Spoke to patient, gave message below.

## 2016-03-03 NOTE — Telephone Encounter (Signed)
[facility] faxing Initial Adverse Determination -  Not Medically Necessary, need to try and fail viagra.  Please advise.

## 2016-03-08 ENCOUNTER — Other Ambulatory Visit: Payer: Self-pay | Admitting: Primary Care

## 2016-05-09 ENCOUNTER — Telehealth: Payer: Self-pay | Admitting: Primary Care

## 2016-05-09 NOTE — Telephone Encounter (Signed)
I left a message to call the office back to schedule a appointment for HTN.

## 2016-05-09 NOTE — Telephone Encounter (Signed)
FUA made for 06/30/16.

## 2016-05-19 ENCOUNTER — Encounter: Payer: Self-pay | Admitting: Primary Care

## 2016-05-22 ENCOUNTER — Encounter: Payer: Self-pay | Admitting: Primary Care

## 2016-05-26 ENCOUNTER — Encounter: Payer: Self-pay | Admitting: Primary Care

## 2016-05-26 ENCOUNTER — Ambulatory Visit: Payer: PRIVATE HEALTH INSURANCE | Attending: Primary Care | Admitting: Primary Care

## 2016-05-26 ENCOUNTER — Other Ambulatory Visit: Payer: Self-pay | Admitting: Primary Care

## 2016-05-26 VITALS — BP 135/90 | HR 82 | Ht 68.0 in | Wt 239.0 lb

## 2016-05-26 DIAGNOSIS — M5412 Radiculopathy, cervical region: Secondary | ICD-10-CM

## 2016-05-26 LAB — PCMH DEPRESSION ASSESSMENT

## 2016-05-26 MED ORDER — METHYLPHENIDATE HCL 30 MG PO CP24 *A*
30.0000 mg | ORAL_CAPSULE | Freq: Every day | ORAL | 0 refills | Status: DC
Start: 2016-05-26 — End: 2016-08-21

## 2016-05-26 MED ORDER — METHYLPREDNISOLONE 4 MG PO TBPK *A*
ORAL_TABLET | ORAL | 0 refills | Status: DC
Start: 2016-05-26 — End: 2016-08-04

## 2016-05-26 NOTE — Telephone Encounter (Signed)
istop checked

## 2016-05-26 NOTE — Progress Notes (Signed)
Patient presents today for...  Neck-a couple of years ago saw Dr. Barrington Ellison for a neck issue, was told it was a pinched nerve but could be managed conservatively. Did go away. About a month ago, seemed to come back, Numbness down the arm to the index and the thumb, those are numb all the time. He will get the arm part if he extends arm up, if he flexes will lessen it. He started working with a Restaurant manager, fast food. He told him he needed to see [place], maybe trial of anti inflammatory med. He's not trying any med. No pain, but hand grip a little numb.     HTN-he is down 7-8 pounds, going to the gym regularly over the past 3-4 weeks, doing a mediterranean diet.   MEDICAL PROBLEMS  Patient Active Problem List   Diagnosis Code    Anxiety F41.9    Depression F32.9    ADD (attention deficit disorder) F98.8    Unspecified essential hypertension I10    Obesity E66.9    OSA (obstructive sleep apnea) G47.33    Erectile dysfunction N52.9       CURRENT ALLERGIES  Review of patient's allergies indicates no known allergies (drug, envir, food or latex).    CURRENT MEDICATIONS  Current Outpatient Prescriptions   Medication Sig    losartan (COZAAR) 50 MG tablet Take 1 tablet (50 mg total) by mouth daily    methylphenidate (RITALIN LA) 30 MG 24 hr capsule Take 1 capsule (30 mg total) by mouth daily (before breakfast)   Max daily dose: 30 mg MDD 40 mg  Code B    escitalopram (LEXAPRO) 20 MG tablet Take 1 tablet (20 mg total) by mouth daily    auto-titrating CPAP (AUTOSET) machine S10 AutoCPAP with min pres of 5 cm-max pres of 18 cm. Dispense with humidifier,heated tubing, & all required equipment. Duration: lifetime    fenofibrate (TRICOR) 48 MG tablet TAKE 1 TABLET (48 MG TOTAL) BY MOUTH DAILY     No current facility-administered medications for this visit.        The patients medications and allergies were reviewed, reconciled as needed and no changes were made.    EXAMINATION  Vitals:    05/26/16 1331   BP: 146/85   Pulse: 82   Weight:  108.4 kg (239 lb)   Height: 1.727 m (5\' 8" )     BP Readings from Last 4 Encounters:   05/26/16 146/85   02/22/16 (!) 140/98   08/31/15 112/80   07/30/15 120/80     Last weight   05/26/16 108.4 kg (239 lb)   02/22/16 110.7 kg (244 lb)   10/31/15 108.4 kg (239 lb)   08/31/15 108.9 kg (240 lb)     Body mass index is 36.34 kg/(m^2).  Last 4 Weights    05/26/16 1331   Weight: 108.4 kg (239 lb)     Extension and left rotation of the neck does seem to cause some lateral arm numbness going down of the thumb.  However Phalen's maneuver and Tinel's testing at the left wrist also exacerbates at least finger and thumb component.  Some mild weakness to thumb finger opposition left compared to right but is right hand dominant.  Otherwise good normal handgrip strength good wrist flexion extension strength.  Reflexes are symmetric at the elbows.  Pulses are symmetric at the wrist.  No point tenderness to the cervical spine.        IMPRESSION/PLAN   Neck pain with some cervical radicular character  to it although he also has some local findings for carpal tunnel.  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  I suppose it wouldn't hurt if he wanted to try some carpal tunnel braces at night but we focused most of cervical issues.  Chiropractor suggested he discuss a course of steroids with [place].  Suppose that might have a possible benefit of decreasing swelling in both areas although with oral course might be little less effective.  We discussed potential side effects of steroids and will give him a Medrol Dosepak.  We discussed that for most people steroids are effective in this area that sometimes is only a temporary fix however since  90% of disc issues get better with time if we can buy some time with some lessening of his symptoms perhaps things will resolve on their own.  The fact that he doesn't have  weakness or muscular limits based on his exam gives [place] time to work with this.  If things flare after the steroid course will consider a longer course of a nonsteroidal

## 2016-05-27 ENCOUNTER — Other Ambulatory Visit: Payer: Self-pay | Admitting: Primary Care

## 2016-05-27 MED ORDER — TADALAFIL 20 MG PO TABS *I*
20.0000 mg | ORAL_TABLET | Freq: Every day | ORAL | 5 refills | Status: DC | PRN
Start: 2016-05-27 — End: 2016-10-27

## 2016-06-05 ENCOUNTER — Encounter: Payer: Self-pay | Admitting: Primary Care

## 2016-06-06 ENCOUNTER — Other Ambulatory Visit: Payer: Self-pay | Admitting: Primary Care

## 2016-06-06 MED ORDER — FENOFIBRATE 48 MG PO TABS *I*
48.0000 mg | ORAL_TABLET | Freq: Every day | ORAL | 3 refills | Status: DC
Start: 2016-06-06 — End: 2016-07-24

## 2016-06-30 ENCOUNTER — Ambulatory Visit: Payer: Self-pay | Admitting: Primary Care

## 2016-07-02 ENCOUNTER — Ambulatory Visit: Payer: Self-pay | Admitting: Primary Care

## 2016-07-15 ENCOUNTER — Encounter: Payer: Self-pay | Admitting: Primary Care

## 2016-07-24 ENCOUNTER — Other Ambulatory Visit: Payer: Self-pay | Admitting: Primary Care

## 2016-07-24 MED ORDER — FENOFIBRATE 48 MG PO TABS *I*
48.0000 mg | ORAL_TABLET | Freq: Every day | ORAL | 3 refills | Status: DC
Start: 2016-07-24 — End: 2017-08-18

## 2016-07-24 NOTE — Telephone Encounter (Signed)
90 day supply

## 2016-08-04 ENCOUNTER — Ambulatory Visit: Payer: PRIVATE HEALTH INSURANCE | Attending: Primary Care | Admitting: Primary Care

## 2016-08-04 VITALS — BP 126/85 | HR 74 | Ht 68.0 in | Wt 236.0 lb

## 2016-08-04 DIAGNOSIS — M5412 Radiculopathy, cervical region: Secondary | ICD-10-CM

## 2016-08-04 MED ORDER — ALPRAZOLAM 0.25 MG PO TABS *I*
0.2500 mg | ORAL_TABLET | Freq: Three times a day (TID) | ORAL | 0 refills | Status: DC | PRN
Start: 2016-08-04 — End: 2017-01-23

## 2016-08-04 NOTE — Progress Notes (Signed)
Patient presents today for...  1.  HTN-he feels good on the higher dose of losartan, 3 times a week to the gym, walking dog.  Denies any lightheadedness or dizziness.  No chest pain.  2.  ADD is taking the ritalin, doing OK  With that.  No side effects and that does help focus level.  He did take medication today.  3.  Anxiety-on the Lexapro for this and for some depression.  He feels his depression is good.  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  Some circumstances where this gets extremely stressed out feels like is going explode.  Coworker gave him half a Xanax tablet for one of those days he took it and it helped calm him down.  He felt that sense of calm persisted for the rest of the week.  He was wondering if we might consider having you on that medication.  4.  Sleep apnea-Doing CPAP every night, rested, feels better on treatment.   5.  Neck-main reason for his follow-up today.  We saw him back in February for this.  He feels it has not changed much at all.  Gets numbness that goes down the arm all the way to the index finger, used to be the thumb but notes index middle only.  Symptom comes up whenever he holds his arm up at 90 or above.  Is really not a lot of pain with it although he used to work discomfort wants.  He's not noticed any weakness.  Tried him on a steroid course that didn't make any difference.  He's getting frustrated enough with the symptom that he wants to look into it further although he's not certain he would elect for surgery at this point.      He still remains off cigarettes.  Coming up on the anniversary of his quit for that.    MEDICAL PROBLEMS  Patient Active Problem List   Diagnosis Code    Anxiety F41.9    Depression F32.9    ADD (attention deficit disorder) F98.8    Unspecified essential hypertension I10    Obesity E66.9    OSA (obstructive  sleep apnea) G47.33    Erectile dysfunction N52.9       CURRENT ALLERGIES  Review of patient's allergies indicates no known allergies (drug, envir, food or latex).    CURRENT MEDICATIONS  Current Outpatient Prescriptions   Medication Sig    fenofibrate (TRICOR) 48 MG tablet Take 1 tablet (48 mg total) by mouth daily    tadalafil (CIALIS) 20 MG tablet Take 1 tablet (20 mg total) by mouth daily as needed for Erectile Dysfunction   Take at least 30 minutes prior to sexual activity.    methylphenidate (RITALIN LA) 30 MG 24 hr capsule Take 1 capsule (30 mg total) by mouth daily (before breakfast)   Max daily dose: 30 mg MDD 40 mg  Code B    losartan (COZAAR) 50 MG tablet Take 1 tablet (50 mg total) by mouth daily    escitalopram (LEXAPRO) 20 MG tablet Take 1 tablet (20 mg total) by mouth daily    auto-titrating CPAP (AUTOSET) machine S10 AutoCPAP with min pres of 5 cm-max pres of 18 cm. Dispense with humidifier,heated tubing, & all required equipment. Duration: lifetime     No current facility-administered medications for this visit.  The patients medications and allergies were reviewed, reconciled as needed and no changes were made.    EXAMINATION  Vitals:    08/04/16 0900   BP: 126/85   Pulse: 74   Weight: 107 kg (236 lb)   Height: 1.727 m (5\' 8" )     BP Readings from Last 4 Encounters:   08/04/16 126/85   05/26/16 135/90   02/22/16 (!) 140/98   08/31/15 112/80     Last weight   08/04/16 107 kg (236 lb)   05/26/16 108.4 kg (239 lb)   02/22/16 110.7 kg (244 lb)   10/31/15 108.4 kg (239 lb)     Body mass index is 35.88 kg/(m^2).  Last 4 Weights    08/04/16 0900   Weight: 107 kg (236 lb)     Carotids show no audible bruits.  No point tenderness in neck.  Extension and lateral bending to the left does cause some of his arm symptoms.  Shoulder range of motion seems to be tolerated well and does not bring on symptoms.  He has 5 out of 5 strength in the left arm testing internal/external rotation of the shoulder,  flexion extension of the elbow flexion extension of the wrist and hand grip.  Reflexes 1+ at the biceps.  No axillary masses.    IMPRESSION/PLAN     1.  Cervical radicular symptoms-seems a little odd that elevation of the arm brings on the symptoms but perhaps this is causing some traction on the nerve.  I think it might be more brachial plexus but he really does feel like it starts in the back of the shoulder and radiates down the arm.  Pathways pretty consistent with a C6 or C7 radicular pathway.  We'll obtain some imaging of the neck communicate within those results and he can make a decision if he wants to pursue further workup based on that.  2.  Hypertension-improved control on increased dose losartan.  Continue that.  3.  ADD-no change continue Ritalin dose.  4.  Anxiety-discussed some of the pros and cons of the benzodiazepine family.  Reviewed risk of addiction, sedation.  Ideally would like him to try to work on developing some behavioral approaches for relaxation techniques.  Decided given the small amount of the medication in case symptoms are severe.  At this point his estimate was that he wouldn't need any more than once a week.  We'll keep an eye out for frequency of use.  Continue Lexapro.

## 2016-08-13 ENCOUNTER — Encounter: Payer: Self-pay | Admitting: Primary Care

## 2016-08-15 ENCOUNTER — Encounter: Payer: Self-pay | Admitting: Primary Care

## 2016-08-18 ENCOUNTER — Ambulatory Visit
Admission: RE | Admit: 2016-08-18 | Discharge: 2016-08-18 | Disposition: A | Discharge status: Home / Self Care | Payer: PRIVATE HEALTH INSURANCE | Source: Ambulatory Visit | Attending: Primary Care | Admitting: Primary Care

## 2016-08-18 DIAGNOSIS — M5412 Radiculopathy, cervical region: Secondary | ICD-10-CM

## 2016-08-18 DIAGNOSIS — M5382 Other specified dorsopathies, cervical region: Secondary | ICD-10-CM

## 2016-08-18 DIAGNOSIS — M4802 Spinal stenosis, cervical region: Secondary | ICD-10-CM

## 2016-08-18 DIAGNOSIS — M4722 Other spondylosis with radiculopathy, cervical region: Secondary | ICD-10-CM

## 2016-08-21 ENCOUNTER — Telehealth: Payer: Self-pay | Admitting: Primary Care

## 2016-08-21 ENCOUNTER — Other Ambulatory Visit: Payer: Self-pay | Admitting: Primary Care

## 2016-08-21 DIAGNOSIS — M5412 Radiculopathy, cervical region: Secondary | ICD-10-CM

## 2016-08-21 MED ORDER — METHYLPHENIDATE HCL 30 MG PO CP24 *A*
30.0000 mg | ORAL_CAPSULE | Freq: Every day | ORAL | 0 refills | Status: DC
Start: 2016-08-21 — End: 2016-11-24

## 2016-08-21 NOTE — Telephone Encounter (Signed)
Sent a my chart message and left a message

## 2016-08-21 NOTE — Telephone Encounter (Signed)
Istop checked

## 2016-08-21 NOTE — Telephone Encounter (Signed)
MRI of the neck showed a combination of some changes of arthritis and some bulging discs. That combines to narrow some of the exit points of the nerves so that might be pinching his nerve at times. I'll send him to one of the doctors at the spine center to see if they feel there are any options here. (we'll choose one of the non-operative doctors)

## 2016-09-05 ENCOUNTER — Encounter: Payer: Self-pay | Admitting: Primary Care

## 2016-09-25 DIAGNOSIS — H5213 Myopia, bilateral: Secondary | ICD-10-CM

## 2016-09-27 ENCOUNTER — Ambulatory Visit: Payer: PRIVATE HEALTH INSURANCE | Attending: Primary Care | Admitting: Neurosurgery

## 2016-09-27 DIAGNOSIS — M542 Cervicalgia: Secondary | ICD-10-CM

## 2016-09-27 DIAGNOSIS — M4712 Other spondylosis with myelopathy, cervical region: Secondary | ICD-10-CM

## 2016-09-27 NOTE — Progress Notes (Signed)
Dear Dr. Lorna Hughes                      I had the pleasure of evaluating Ronald Hughes in consideration of his 6-8 month history of numbness in the L trapezius, L arm, forearm and 1-2 digit numbness in the L hand.      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    Initially he noted "stiffness" in the L trap and shoulder with distal radiation of the numbness into the L arm.    Interestingly no neck pain, scapular or chest pain, nor any subjective loss of strength in the L UE.    He was evaluated by Dr. Leverne Hughes several years ago for the same complaint and he made a nice recovery without surgery.    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    Review of Systems: Twelve systems have been reviewed and pertinent positives noted in HPI.  All other systems negative.  Patient Active Problem List   Diagnosis Code    Anxiety F41.9    Depression F32.9    ADD (attention deficit disorder) F98.8    Unspecified essential hypertension I10    Obesity E66.9    OSA (obstructive sleep apnea) G47.33    Erectile dysfunction N52.9       Physical Exam:  Constitutional:  Vital Signs:  There were no vitals taken for this visit.    General: Awake, alert, no acute distress  HEENT: Normal appearance   ( sclerae, conjunctiva, nose, teeth/mouth, ears)  Musculoskeletal:   Gait steady and well balanced  No weakness on toe and heel walking  Back: Well-aligned, non-tender to palpation  Full CROM   No radiating UE pain with neck movement however with L tilting he has increasing L arm numbness  Neurologic: Alert and oriented x 3.   Speech clear  CN II-XII intact  Strength 5/5 bilaterally   Pin with mild hypesthesia of the L thumb  Reflexes normoactive and symmetrical   Finger taps symmetric  Romberg and pronator drift normal  Vibration intact  Proprioception intact  Conclusion:  Ronald Hughes presents with L C6 and C7 foraminal stenosis with  subjective numbness in a similar distribution in the L UE.  His neurological exam does not define any neck pain, radiating arm pain, or loss of strength or weakness. Despite his generally favorable exam and the absence of neck or arm pain, he is quite bothered by the arm numbness and would like to consider a L C6 and C7 foraminotomy in November.  I will recommend a CT of the cervical spine without contrast and will revisit with him in October in consideration of his L C6 and C7 surgery.    Ronald Pu, MD on 09/27/2016 at 11:54 AM

## 2016-09-29 ENCOUNTER — Other Ambulatory Visit: Payer: Self-pay | Admitting: Neurosurgery

## 2016-09-29 DIAGNOSIS — M5030 Other cervical disc degeneration, unspecified cervical region: Secondary | ICD-10-CM

## 2016-10-08 ENCOUNTER — Ambulatory Visit
Admission: RE | Admit: 2016-10-08 | Discharge: 2016-10-08 | Disposition: A | Discharge status: Home / Self Care | Payer: PRIVATE HEALTH INSURANCE | Source: Ambulatory Visit

## 2016-10-08 DIAGNOSIS — M4802 Spinal stenosis, cervical region: Secondary | ICD-10-CM

## 2016-10-08 DIAGNOSIS — M5030 Other cervical disc degeneration, unspecified cervical region: Secondary | ICD-10-CM

## 2016-10-08 DIAGNOSIS — M5031 Other cervical disc degeneration,  high cervical region: Secondary | ICD-10-CM

## 2016-10-27 ENCOUNTER — Other Ambulatory Visit: Payer: Self-pay | Admitting: Primary Care

## 2016-10-27 MED ORDER — TADALAFIL 20 MG PO TABS *I*
20.0000 mg | ORAL_TABLET | Freq: Every day | ORAL | 5 refills | Status: AC | PRN
Start: 2016-10-27 — End: 2017-04-25

## 2016-11-18 ENCOUNTER — Other Ambulatory Visit: Payer: Self-pay | Admitting: Primary Care

## 2016-11-18 NOTE — Telephone Encounter (Signed)
I stop checked, last seen 08-04-16    08/21/2016 08/26/2016 methylphenidate er 30 mg cap  [address], [place] [phone]  08/04/2016 08/04/2016 alprazolam 0.25 mg tablet  [address], [place] [phone]

## 2016-11-24 ENCOUNTER — Other Ambulatory Visit: Payer: Self-pay | Admitting: Primary Care

## 2016-11-24 MED ORDER — METHYLPHENIDATE HCL 30 MG PO CP24 *A*
30.0000 mg | ORAL_CAPSULE | Freq: Every day | ORAL | 0 refills | Status: DC
Start: 2016-11-24 — End: 2017-02-19

## 2016-11-24 NOTE — Telephone Encounter (Signed)
08/21/2016 08/26/2016 methylphenidate er 30 mg cap  52 [address] Bea Graff A MD        LV 08/04/16

## 2016-12-27 ENCOUNTER — Ambulatory Visit: Payer: PRIVATE HEALTH INSURANCE | Attending: Primary Care | Admitting: Neurosurgery

## 2016-12-27 DIAGNOSIS — M542 Cervicalgia: Secondary | ICD-10-CM

## 2016-12-27 NOTE — Progress Notes (Signed)
Dear Dr. Lorna Dibble                                    I had the pleasure of re evaluating Ronald Hughes in consideration of his neck and arm symptoms.    He presented in July with L C6 and C7 foraminal stenosis with subjective numbness in a similar distribution in the L UE.      His neurological exam did not define any neck pain, radiating arm pain, or loss of strength or weakness. Despite his generally favorable exam and the absence of neck or arm pain, he was quite bothered by the arm numbness and wanted to consider a L C6 and C7 foraminotomy. I recommended a CT of the cervical spine without contrast and resolved to meet with him to discuss the surgical option.    Today he states that at this time he has no weakness; the numbness is still present in the L UE mainly in the thumb and index finger of the L hand.  He states that "I am so used to it at this point that I have compensated to it.:  He does endorse stiffness in the neck and trapezius soreness on the L. Neck turning may increase his L hand numbness.    PSH: No cervical surgery    IMAGING: CT scan from July with evidence of a cervical kyphosis and severe compression of the L C6 and C7 foramina    Review of Systems:pertinent positives noted above  Patient Active Problem List   Diagnosis Code    Anxiety F41.9    Depression F32.9    ADD (attention deficit disorder) F98.8    Unspecified essential hypertension I10    Obesity E66.9    OSA (obstructive sleep apnea) G47.33    Erectile dysfunction N52.9       Physical Exam:  Constitutional:  Vital Signs:  There were no vitals taken for this visit.  General:  Awake, alert, no acute distress.  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  He is interested in pursuing a L C6 and C7 foraminotomy and this will be scheduled in the near future.      [name], MD on 12/27/2016 at 9:22 AM

## 2017-01-23 ENCOUNTER — Encounter: Payer: Self-pay | Admitting: Primary Care

## 2017-01-23 ENCOUNTER — Other Ambulatory Visit
Admission: RE | Admit: 2017-01-23 | Discharge: 2017-01-23 | Disposition: A | Discharge status: Home / Self Care | Payer: PRIVATE HEALTH INSURANCE | Source: Ambulatory Visit | Attending: Primary Care | Admitting: Primary Care

## 2017-01-23 ENCOUNTER — Ambulatory Visit: Payer: PRIVATE HEALTH INSURANCE | Attending: Primary Care | Admitting: Primary Care

## 2017-01-23 VITALS — BP 130/84 | HR 69 | Ht 68.0 in | Wt 242.0 lb

## 2017-01-23 DIAGNOSIS — M5412 Radiculopathy, cervical region: Secondary | ICD-10-CM

## 2017-01-23 DIAGNOSIS — I10 Essential (primary) hypertension: Secondary | ICD-10-CM | POA: Insufficient documentation

## 2017-01-23 DIAGNOSIS — F988 Other specified behavioral and emotional disorders with onset usually occurring in childhood and adolescence: Secondary | ICD-10-CM

## 2017-01-23 LAB — CBC AND DIFFERENTIAL
Baso # K/uL: 0 10*3/uL (ref 0.0–0.1)
Basophil %: 0.5 %
Eos # K/uL: 0.1 10*3/uL (ref 0.0–0.5)
Eosinophil %: 1.5 %
Hematocrit: 43 % (ref 40–51)
Hemoglobin: 14.4 g/dL (ref 13.7–17.5)
IMM Granulocytes #: 0 10*3/uL (ref 0.0–0.1)
IMM Granulocytes: 0.5 %
Lymph # K/uL: 2.1 10*3/uL (ref 1.3–3.6)
Lymphocyte %: 28.1 %
MCH: 29 pg/cell (ref 26–32)
MCHC: 33 g/dL (ref 32–37)
MCV: 89 fL (ref 79–92)
Mono # K/uL: 0.7 10*3/uL (ref 0.3–0.8)
Monocyte %: 9.4 %
Neut # K/uL: 4.5 10*3/uL (ref 1.8–5.4)
Nucl RBC # K/uL: 0 10*3/uL (ref 0.0–0.0)
Nucl RBC %: 0 /100 WBC (ref 0.0–0.2)
Platelets: 203 10*3/uL (ref 150–330)
RBC: 4.9 MIL/uL (ref 4.6–6.1)
RDW: 12.2 % (ref 11.6–14.4)
Seg Neut %: 60 %
WBC: 7.5 10*3/uL (ref 4.2–9.1)

## 2017-01-23 LAB — PROTIME-INR
INR: 0.9 (ref 0.9–1.1)
Protime: 10.4 s (ref 10.0–12.9)

## 2017-01-23 LAB — BASIC METABOLIC PANEL
Anion Gap: 11 (ref 7–16)
CO2: 26 mmol/L (ref 20–28)
Calcium: 9.6 mg/dL (ref 8.6–10.2)
Chloride: 106 mmol/L (ref 96–108)
Creatinine: 0.98 mg/dL (ref 0.67–1.17)
GFR,Black: 102 *
GFR,Caucasian: 88 *
Glucose: 88 mg/dL (ref 60–99)
Lab: 26 mg/dL — ABNORMAL HIGH (ref 6–20)
Potassium: 4.8 mmol/L (ref 3.3–5.1)
Sodium: 143 mmol/L (ref 133–145)

## 2017-01-23 LAB — APTT: aPTT: 33.8 s (ref 25.8–37.9)

## 2017-01-23 NOTE — Progress Notes (Signed)
Patient presents today for...  Here for pre op check, getting cervical laminectomy mon after thanksgiving.   Needs flu shot.  Feels well, no cough, no fever, no chill, no URI symptoms, no sore throat.  He's done 30 min on TM, tolerated that well.   Surgeon Dr. Dierdre Forth. Getting a foraminotomy.   He's been dealing with left arm numbness and sense of weakness, no pain.   No previous surgery, did have colonoscopy with no problems with sedative/meds used during the procedure.Marland Kitchen   No FH of problems of problems with anaesthetics.     Hypertension-seems to be tolerating his medication.  No lightheadedness or dizziness.  Sleep apnea-he does use C Pap on a regular basis.  DVT-on methylphenidate takes it pretty much daily.  Finds if he doesn't affects focus.      MEDICAL PROBLEMS  Patient Active Problem List   Diagnosis Code    Anxiety F41.9    Depression F32.9    ADD (attention deficit disorder) F98.8    Unspecified essential hypertension I10    Obesity E66.9    OSA (obstructive sleep apnea) G47.33    Erectile dysfunction N52.9       CURRENT ALLERGIES  Review of patient's allergies indicates no known allergies (drug, envir, food or latex).    CURRENT MEDICATIONS  Current Outpatient Prescriptions   Medication Sig    methylphenidate (RITALIN LA) 30 MG 24 hr capsule Take 1 capsule (30 mg total) by mouth daily (before breakfast)   Max daily dose: 30 mg MDD 40 mg  Code B    escitalopram (LEXAPRO) 20 MG tablet TAKE 1 TABLET BY MOUTH DAILY    tadalafil (CIALIS) 20 MG tablet Take 1 tablet (20 mg total) by mouth daily as needed for Erectile Dysfunction   Take at least 30 minutes prior to sexual activity.    fenofibrate (TRICOR) 48 MG tablet Take 1 tablet (48 mg total) by mouth daily    losartan (COZAAR) 50 MG tablet Take 1 tablet (50 mg total) by mouth daily    auto-titrating CPAP (AUTOSET) machine S10 AutoCPAP with min pres of 5 cm-max pres of 18 cm. Dispense with humidifier,heated tubing, & all required equipment.  Duration: lifetime     No current facility-administered medications for this visit.        The patients medications and allergies were reviewed, reconciled as needed and no changes were made.    EXAMINATION  Vitals:    01/23/17 0816   BP: 130/84   Pulse: 69   SpO2: 96%   Weight: 109.8 kg (242 lb)   Height: 1.727 m (5\' 8" )     BP Readings from Last 4 Encounters:   01/23/17 130/84   08/04/16 126/85   05/26/16 135/90   02/22/16 (!) 140/98     Last weight   01/23/17 109.8 kg (242 lb)   08/04/16 107 kg (236 lb)   08/18/16 106.6 kg (235 lb)   05/26/16 108.4 kg (239 lb)     Body mass index is 36.8 kg/(m^2).  Last 4 Weights    01/23/17 0816   Weight: 109.8 kg (242 lb)     Physical Exam   Constitutional: He appears well-developed and well-nourished.   Neck: No thyromegaly present.   Cardiovascular: Normal rate, regular rhythm and normal heart sounds.    No murmur heard.  Pulmonary/Chest: Effort normal and breath sounds normal. No respiratory distress. He has no wheezes. He has no rales.   Musculoskeletal: He exhibits no edema.  Lymphadenopathy:     He has no cervical adenopathy.     My recheck blood pressure was a little bit higher to 130/90.    EKG shows a normal sinus rhythm.  No significant change from previous EKGs.  Overall ST intervals no Q waves.  No significant T-wave abnormalities.        IMPRESSION/PLAN   1.  Hypertension-little upper range of where my target would be for him however he admittedly is little nervous about surgery.  Looking back of her previous numbers seem to be in a fairly reasonable range.  No change to his meds.  2.  ADD-recommended he not take his stimulant day of procedure.  Really doesn't need it that day there certainly is a chance that's part of why his blood pressure runs a little high.  Hopefully omission of that the day of the surgery will counteract the bump in his blood pressure we will likely see due to his stress.  3.  Cervical radicular symptoms-he's going down for his blood work  assuming that's reasonable we'll go ahead and okay him.

## 2017-02-04 NOTE — Anesthesia Preprocedure Evaluation (Addendum)
Anesthesia Pre-operative History and Physical for Ronald Hughes    Highlighted Issues for this Procedure:  51 y.o. male with Cervical spondylosis with myelopathy (M47.12) presenting for Procedure(s):  C6-C7 FORAMINOTOMY, (POSTERIOR SITTING, C-Arm) by Surgeon(s):  [name], MD scheduled for 150 minutes.    PMH  -Cervical radiculopathy with LUE numbness but no pain, no myelopathy.  -HTN: losartan  -Severe OSA: CPAP compliant  -ADHD: methylphenidate daily. Instructed by PCP to not take DOS  -Obesity: BMI 36      .  Anesthesia Evaluation Information Source: records, patient     ANESTHESIA HISTORY  Pertinent(-):  No History of anesthetic complications or Family hx of anesthetic complications    GENERAL    + Obesity    HEENT    + Neck Pain PULMONARY    + Sleep apnea          CPAP and noncompliant  Pertinent(-):  No asthma, recent URI or COPD    CARDIOVASCULAR    + Hypertension          well controlled, took ARB/ACEI today    GI/HEPATIC/RENAL  Last PO Intake: >8hr before procedure and >2hr before procedure (clears) NEURO/PSYCH    + Psychiatric Issues          depression    + Spinal Injury/defect ( LUE numbness with severe C6-7 foraminal stenosis)          cervical and chronic    ENDO/OTHER  Pertinent(-):  No diabetes mellitus, thyroid disease           Physical Exam    Airway            Mouth opening: normal            Mallampati: I            TM distance (fb): >3 FB            TM distance (cm): 3            Neck ROM: full            Airway Impression: easy  Dental        Comment: 4 upper front teeth are veneers   Cardiovascular  Normal Exam      Neurologic      + Sensory deficit ( LUE)     Pulmonary   Normal Exam    Mental Status   Normal Exam    oriented to person, place and time       ________________________________________________________________________  PLAN  ASA Score  2  Anesthetic Plan general     Induction (routine IV) General Anesthesia/Sedation Maintenance Plan (inhaled agents, neuromuscular blockade and  IV bolus);  Airway Manipulation (video laryngoscope and direct laryngoscopy); Airway (cuffed ETT); Line ( use current access); Monitoring (standard ASA); Positioning (sitting); PONV Plan (dexamethasone and ondansetron); Pain (per surgical team); PostOp (PACU)    Informed Consent     Risks:          Risks discussed were commensurate with the plan listed above with the following specific points: N/V, aspiration, sore throat and hypotension , damage to:(eyes, nerves, teeth), allergic Rx, awareness, death    Anesthetic Consent:         Anesthetic plan (and risks as noted above) were discussed with patient    Blood products Consent:        Use of blood products discussed with: patient     Plan also discussed with team members including:  attending and resident    Attending Attestation:  As the primary attending anesthesiologist, I attest that the patient or proxy understands and accepts the risks and benefits of the anesthesia plan. I also attest that I have personally performed a pre-anesthetic examination and evaluation, and prescribed the anesthetic plan for this particular location within 48 hours prior to the anesthetic as documented. Carren Rang, MD 7:31 AM

## 2017-02-09 ENCOUNTER — Encounter
Admission: RE | Disposition: A | Discharge status: Home / Self Care | Payer: Self-pay | Source: Ambulatory Visit | Attending: Neurosurgery

## 2017-02-09 ENCOUNTER — Inpatient Hospital Stay: Payer: PRIVATE HEALTH INSURANCE | Admitting: Student in an Organized Health Care Education/Training Program

## 2017-02-09 ENCOUNTER — Encounter: Payer: Self-pay | Admitting: Neurosurgery

## 2017-02-09 ENCOUNTER — Inpatient Hospital Stay
Admission: RE | Admit: 2017-02-09 | Discharge: 2017-02-09 | Disposition: A | Discharge status: Home / Self Care | Payer: PRIVATE HEALTH INSURANCE | Source: Ambulatory Visit | Attending: Neurosurgery | Admitting: Neurosurgery

## 2017-02-09 ENCOUNTER — Inpatient Hospital Stay: Payer: PRIVATE HEALTH INSURANCE

## 2017-02-09 DIAGNOSIS — M4712 Other spondylosis with myelopathy, cervical region: Secondary | ICD-10-CM

## 2017-02-09 DIAGNOSIS — M47812 Spondylosis without myelopathy or radiculopathy, cervical region: Secondary | ICD-10-CM

## 2017-02-09 DIAGNOSIS — F419 Anxiety disorder, unspecified: Secondary | ICD-10-CM | POA: Diagnosis present

## 2017-02-09 DIAGNOSIS — M50323 Other cervical disc degeneration at C6-C7 level: Secondary | ICD-10-CM

## 2017-02-09 DIAGNOSIS — M4802 Spinal stenosis, cervical region: Secondary | ICD-10-CM | POA: Insufficient documentation

## 2017-02-09 DIAGNOSIS — I10 Essential (primary) hypertension: Secondary | ICD-10-CM | POA: Insufficient documentation

## 2017-02-09 HISTORY — PX: PR LAMINEC/FACETECT/FORAMIN,CERVICAL 1 SEG: 63045

## 2017-02-09 HISTORY — PX: CERVICAL SPINE SURGERY: SHX589

## 2017-02-09 HISTORY — PX: PR LAM FACETECTOMY & FORAMOTOMY 1 VRT SGM CERVICAL: 63045

## 2017-02-09 HISTORY — DX: Other cervical disc degeneration at C6-C7 level: M50.323

## 2017-02-09 LAB — POCT GLUCOSE: Glucose POCT: 85 mg/dL (ref 60–99)

## 2017-02-09 SURGERY — LAMINECTOMY, SPINE, CERVICAL, WITH DECOMPRESSION, SITTING POSITION
Anesthesia: General | Site: Spine Cervical | Laterality: Left | Wound class: Clean

## 2017-02-09 MED ORDER — OXYCODONE HCL 5 MG PO TABS *I*
10.0000 mg | ORAL_TABLET | ORAL | Status: DC | PRN
Start: 2017-02-09 — End: 2017-02-09
  Administered 2017-02-09: 10 mg via ORAL

## 2017-02-09 MED ORDER — SODIUM CHLORIDE 0.9 % IV SOLN WRAPPED *I*
20.0000 mL/h | Status: DC
Start: 2017-02-09 — End: 2017-02-09

## 2017-02-09 MED ORDER — BISACODYL 10 MG RE SUPP *I*
10.0000 mg | Freq: Every day | RECTAL | Status: DC | PRN
Start: 2017-02-09 — End: 2017-02-09

## 2017-02-09 MED ORDER — OXYCODONE HCL 5 MG PO TABS *I*
ORAL_TABLET | ORAL | Status: DC
Start: 2017-02-09 — End: 2017-02-09
  Filled 2017-02-09: qty 1

## 2017-02-09 MED ORDER — BUPIVACAINE-EPINEPHRINE 0.5 % IJ SOLUTION *WRAPPED*
INTRAMUSCULAR | Status: AC
Start: 2017-02-09 — End: 2017-02-09
  Filled 2017-02-09: qty 30

## 2017-02-09 MED ORDER — ONDANSETRON HCL 2 MG/ML IV SOLN *I*
1.0000 mg | Freq: Once | INTRAMUSCULAR | Status: DC | PRN
Start: 2017-02-09 — End: 2017-02-09

## 2017-02-09 MED ORDER — SODIUM CHLORIDE 0.9 % IV SOLN WRAPPED *I*
100.0000 mL/h | Status: DC
Start: 2017-02-09 — End: 2017-02-09
  Administered 2017-02-09: 100 mL/h via INTRAVENOUS

## 2017-02-09 MED ORDER — EPHEDRINE 5MG/ML IN NS IV/IJ *WRAPPED*
INTRAMUSCULAR | Status: DC | PRN
Start: 2017-02-09 — End: 2017-02-09
  Administered 2017-02-09: 5 mg via INTRAVENOUS

## 2017-02-09 MED ORDER — CEFAZOLIN 2000 MG IN STERILE WATER 20ML SYRINGE *I*
2000.0000 mg | PREFILLED_SYRINGE | Freq: Three times a day (TID) | INTRAVENOUS | Status: DC
Start: 2017-02-09 — End: 2017-02-09
  Administered 2017-02-09: 2000 mg via INTRAVENOUS

## 2017-02-09 MED ORDER — VASOPRESSIN 20 UNIT/ML IJ SOLN *WRAPPED*
Status: DC | PRN
Start: 2017-02-09 — End: 2017-02-09
  Administered 2017-02-09 (×2): .5 [IU] via INTRAVENOUS
  Administered 2017-02-09 (×2): 1 [IU] via INTRAVENOUS

## 2017-02-09 MED ORDER — BUPIVACAINE-EPINEPHRINE 0.5 % IJ SOLUTION *WRAPPED*
INTRAMUSCULAR | Status: DC | PRN
Start: 2017-02-09 — End: 2017-02-09
  Administered 2017-02-09: 10 mL via SUBCUTANEOUS

## 2017-02-09 MED ORDER — FENOFIBRATE 48 MG PO TABS *I*
48.0000 mg | ORAL_TABLET | Freq: Every day | ORAL | Status: DC
Start: 2017-02-10 — End: 2017-02-09
  Filled 2017-02-09: qty 1

## 2017-02-09 MED ORDER — LIDOCAINE HCL (PF) 1 % IJ SOLN *I*
0.1000 mL | INTRAMUSCULAR | Status: DC | PRN
Start: 2017-02-09 — End: 2017-02-09

## 2017-02-09 MED ORDER — OXYCODONE HCL 5 MG PO TABS *I*
10.0000 mg | ORAL_TABLET | Freq: Four times a day (QID) | ORAL | 0 refills | Status: AC | PRN
Start: 2017-02-09 — End: 2017-02-16

## 2017-02-09 MED ORDER — DEXAMETHASONE SODIUM PHOSPHATE 4 MG/ML INJ SOLN *WRAPPED*
INTRAMUSCULAR | Status: DC | PRN
Start: 2017-02-09 — End: 2017-02-09
  Administered 2017-02-09: 4 mg via INTRAVENOUS

## 2017-02-09 MED ORDER — CEFAZOLIN 2000 MG IN STERILE WATER 20ML SYRINGE *I*
PREFILLED_SYRINGE | INTRAVENOUS | Status: DC
Start: 2017-02-09 — End: 2017-02-09
  Filled 2017-02-09: qty 20

## 2017-02-09 MED ORDER — CEFAZOLIN SODIUM 1000 MG IJ SOLR *I*
INTRAMUSCULAR | Status: DC | PRN
Start: 2017-02-09 — End: 2017-02-09
  Administered 2017-02-09: 2000 mg via INTRAVENOUS

## 2017-02-09 MED ORDER — FENTANYL CITRATE 50 MCG/ML IJ SOLN *WRAPPED*
INTRAMUSCULAR | Status: AC
Start: 2017-02-09 — End: 2017-02-09
  Filled 2017-02-09: qty 2

## 2017-02-09 MED ORDER — DOCUSATE SODIUM 100 MG PO CAPS *I*
100.0000 mg | ORAL_CAPSULE | Freq: Two times a day (BID) | ORAL | 0 refills | Status: DC
Start: 2017-02-09 — End: 2018-03-08

## 2017-02-09 MED ORDER — LIDOCAINE HCL 2 % IJ SOLN *I*
INTRAMUSCULAR | Status: DC | PRN
Start: 2017-02-09 — End: 2017-02-09
  Administered 2017-02-09 (×2): 100 mg via INTRAVENOUS

## 2017-02-09 MED ORDER — ONDANSETRON HCL 2 MG/ML IV SOLN *I*
4.0000 mg | Freq: Four times a day (QID) | INTRAMUSCULAR | Status: DC | PRN
Start: 2017-02-09 — End: 2017-02-09

## 2017-02-09 MED ORDER — INFLUENZA VAC SPLIT QUAD (FLULAVAL) 0.5 ML IM SUSY PF(>=6 MONTHS) *I*
PREFILLED_SYRINGE | INTRAMUSCULAR | Status: DC
Start: 2017-02-09 — End: 2017-02-09
  Filled 2017-02-09: qty 0.5

## 2017-02-09 MED ORDER — PHENYLEPHRINE 100 MCG/ML IN NS 10 ML *WRAPPED*
INTRAMUSCULAR | Status: DC | PRN
Start: 2017-02-09 — End: 2017-02-09
  Administered 2017-02-09: 100 ug via INTRAVENOUS
  Administered 2017-02-09: 200 ug via INTRAVENOUS

## 2017-02-09 MED ORDER — MIDAZOLAM HCL 1 MG/ML IJ SOLN *I* WRAPPED
INTRAMUSCULAR | Status: AC
Start: 2017-02-09 — End: 2017-02-09
  Filled 2017-02-09: qty 2

## 2017-02-09 MED ORDER — ROCURONIUM BROMIDE 10 MG/ML IV SOLN *WRAPPED*
Status: DC | PRN
Start: 2017-02-09 — End: 2017-02-09
  Administered 2017-02-09: 50 mg via INTRAVENOUS

## 2017-02-09 MED ORDER — HYDROMORPHONE HCL 2 MG/ML IJ SOLN *WRAPPED*
INTRAMUSCULAR | Status: AC
Start: 2017-02-09 — End: 2017-02-09
  Filled 2017-02-09: qty 1

## 2017-02-09 MED ORDER — ESCITALOPRAM OXALATE 20 MG PO TABS *I*
20.0000 mg | ORAL_TABLET | Freq: Every day | ORAL | Status: DC
Start: 2017-02-10 — End: 2017-02-09
  Filled 2017-02-09: qty 1

## 2017-02-09 MED ORDER — PHENYLEPHRINE 80 MG (0.32MG/ML) IN NS 250 ML *I*
INTRAVENOUS | Status: AC
Start: 2017-02-09 — End: 2017-02-09
  Filled 2017-02-09: qty 250

## 2017-02-09 MED ORDER — SENNOSIDES 8.6 MG PO TABS *I*
2.0000 | ORAL_TABLET | Freq: Every evening | ORAL | Status: DC
Start: 2017-02-09 — End: 2017-02-09

## 2017-02-09 MED ORDER — BACITRACIN 50000 UNIT IM SOLR *I*
INTRAMUSCULAR | Status: AC
Start: 2017-02-09 — End: 2017-02-09
  Filled 2017-02-09: qty 10

## 2017-02-09 MED ORDER — HYDROMORPHONE HCL 2 MG/ML IJ SOLN *WRAPPED*
0.5000 mg | INTRAMUSCULAR | Status: DC | PRN
Start: 2017-02-09 — End: 2017-02-09
  Administered 2017-02-09 (×2): 0.5 mg via INTRAVENOUS

## 2017-02-09 MED ORDER — KETAMINE HCL 10 MG/ML IJ/IV SOLN *WRAPPED*
Status: AC
Start: 2017-02-09 — End: 2017-02-09
  Filled 2017-02-09: qty 20

## 2017-02-09 MED ORDER — SODIUM CHLORIDE 0.9 % IV SOLN WRAPPED *I*
INTRAMUSCULAR | Status: DC | PRN
Start: 2017-02-09 — End: 2017-02-09
  Administered 2017-02-09: 500 mL

## 2017-02-09 MED ORDER — INFLUENZA VAC SPLIT QUAD (FLULAVAL) 0.5 ML IM SUSY PF(>=6 MONTHS) *I*
0.5000 mL | PREFILLED_SYRINGE | INTRAMUSCULAR | Status: AC
Start: 2017-02-09 — End: 2017-02-09
  Administered 2017-02-09: 0.5 mL via INTRAMUSCULAR

## 2017-02-09 MED ORDER — PHENYLEPHRINE 80 MG (0.32MG/ML) IN NS 250 ML *I*
INTRAVENOUS | Status: DC | PRN
Start: 2017-02-09 — End: 2017-02-09
  Administered 2017-02-09: 50 ug/min via INTRAVENOUS
  Administered 2017-02-09: 30 ug/min via INTRAVENOUS
  Administered 2017-02-09: 25 ug/min via INTRAVENOUS
  Administered 2017-02-09: 50 ug/min via INTRAVENOUS
  Administered 2017-02-09: 25 ug/min via INTRAVENOUS
  Administered 2017-02-09: 50 ug/min via INTRAVENOUS

## 2017-02-09 MED ORDER — SUGAMMADEX SODIUM 100 MG/1ML IV SOLN *WRAPPED*
INTRAVENOUS | Status: AC
Start: 2017-02-09 — End: 2017-02-09
  Filled 2017-02-09: qty 2

## 2017-02-09 MED ORDER — ACETAMINOPHEN 325 MG PO TABS *I*
ORAL_TABLET | ORAL | Status: DC
Start: 2017-02-09 — End: 2017-02-09
  Filled 2017-02-09: qty 2

## 2017-02-09 MED ORDER — ACETAMINOPHEN 325 MG PO TABS *I*
650.0000 mg | ORAL_TABLET | ORAL | Status: DC | PRN
Start: 2017-02-09 — End: 2017-02-09
  Administered 2017-02-09: 650 mg via ORAL

## 2017-02-09 MED ORDER — PROPOFOL 10 MG/ML IV EMUL (INTERMITTENT DOSING) WRAPPED *I*
INTRAVENOUS | Status: DC | PRN
Start: 2017-02-09 — End: 2017-02-09
  Administered 2017-02-09: 70 mg via INTRAVENOUS
  Administered 2017-02-09: 200 mg via INTRAVENOUS

## 2017-02-09 MED ORDER — OXYCODONE HCL 5 MG PO TABS *I*
ORAL_TABLET | ORAL | Status: DC
Start: 2017-02-09 — End: 2017-02-09
  Filled 2017-02-09: qty 2

## 2017-02-09 MED ORDER — SENNOSIDES 8.6 MG PO TABS *I*
2.0000 | ORAL_TABLET | Freq: Every evening | ORAL | 0 refills | Status: AC
Start: 2017-02-09 — End: 2017-03-11

## 2017-02-09 MED ORDER — LACTATED RINGERS IV SOLN *I*
20.0000 mL/h | INTRAVENOUS | Status: DC
Start: 2017-02-09 — End: 2017-02-09

## 2017-02-09 MED ORDER — MORPHINE SULFATE 4 MG/ML IV SOLN *WRAPPED*
2.0000 mg | INTRAVENOUS | Status: DC | PRN
Start: 2017-02-09 — End: 2017-02-09

## 2017-02-09 MED ORDER — THROMBIN (RECOMBINANT) 5000 UNIT EX SOLR *I* WRAPPED
CUTANEOUS | Status: AC
Start: 2017-02-09 — End: 2017-02-09
  Filled 2017-02-09: qty 4

## 2017-02-09 MED ORDER — CEFAZOLIN 1000 MG IN STERILE WATER 10ML SYRINGE *I*
PREFILLED_SYRINGE | INTRAVENOUS | Status: DC
Start: 2017-02-09 — End: 2017-02-09
  Filled 2017-02-09: qty 10

## 2017-02-09 MED ORDER — HALOPERIDOL LACTATE 5 MG/ML IJ SOLN *I*
0.5000 mg | Freq: Once | INTRAMUSCULAR | Status: DC | PRN
Start: 2017-02-09 — End: 2017-02-09

## 2017-02-09 MED ORDER — FENTANYL CITRATE 50 MCG/ML IJ SOLN *WRAPPED*
INTRAMUSCULAR | Status: DC | PRN
Start: 2017-02-09 — End: 2017-02-09
  Administered 2017-02-09: 50 ug via INTRAVENOUS
  Administered 2017-02-09: 25 ug via INTRAVENOUS
  Administered 2017-02-09: 100 ug via INTRAVENOUS
  Administered 2017-02-09: 25 ug via INTRAVENOUS

## 2017-02-09 MED ORDER — LOSARTAN POTASSIUM 50 MG PO TABS *I*
50.0000 mg | ORAL_TABLET | Freq: Every day | ORAL | Status: DC
Start: 2017-02-10 — End: 2017-02-09
  Filled 2017-02-09: qty 1

## 2017-02-09 MED ORDER — DOCUSATE SODIUM 100 MG PO CAPS *I*
100.0000 mg | ORAL_CAPSULE | Freq: Two times a day (BID) | ORAL | Status: DC
Start: 2017-02-09 — End: 2017-02-09

## 2017-02-09 MED ORDER — OXYCODONE HCL 5 MG PO TABS *I*
5.0000 mg | ORAL_TABLET | ORAL | Status: DC | PRN
Start: 2017-02-09 — End: 2017-02-09
  Administered 2017-02-09: 5 mg via ORAL

## 2017-02-09 MED ORDER — PROMETHAZINE HCL 25 MG/ML IJ SOLN *I*
6.2500 mg | Freq: Once | INTRAMUSCULAR | Status: DC | PRN
Start: 2017-02-09 — End: 2017-02-09

## 2017-02-09 MED ORDER — SODIUM CHLORIDE 0.9 % IR SOLN *I*
Status: AC | PRN
Start: 2017-02-09 — End: 2017-02-09
  Administered 2017-02-09: 1000 mL

## 2017-02-09 MED ORDER — SUGAMMADEX SODIUM 100 MG/1ML IV SOLN *WRAPPED*
INTRAVENOUS | Status: DC | PRN
Start: 2017-02-09 — End: 2017-02-09
  Administered 2017-02-09: 200 mg via INTRAVENOUS

## 2017-02-09 SURGICAL SUPPLY — 33 items
APPLICATOR DURAPREP 26ML (Solution) ×6 IMPLANT
BLADE CLIPPER SURG (Supply)
BLADE SUR CLIPPER W37.2MM CUT H0.23MM GEN PURP EXISTING CLP HNDL DISP (Supply) IMPLANT
BLADE SUR W37.2MM CUT H0.23MM GEN PURP EXISTING CLP HNDL DISP (Supply) IMPLANT
BLADE SURG SPECIALTY CLIPPER (Supply) IMPLANT
COVER OR LIGHT DISP CAMERA (Supply) ×3 IMPLANT
ELECTRODE 1.5M (Supply)
ELECTRODE IOM L1.5M PI SUB WDIN DISP (Supply) IMPLANT
ELECTRODE SUBDERMAL CORKSCREW (Supply) IMPLANT
ELECTRODE TWISTED (Supply) IMPLANT
FILTER NEPTUNE 4PORT MANIFOLD (Supply) ×3 IMPLANT
GLOVE BIOGEL PI INDICATOR UNDER SZ 8.5 LF (Glove) ×3 IMPLANT
GLOVE BIOGEL PI MICRO SZ 8 (Glove) ×3 IMPLANT
GOWN SIRIUS RAGLAN NONREINFORCED XL (Gown) ×3 IMPLANT
PACK CUSTOM SITTING CERVICAL CDS (Pack) ×3 IMPLANT
PIN MAYFIELD SKULL DISP ADULT (Other) ×3 IMPLANT
PLATES GROUND DISP 2 X 4 (Supply) IMPLANT
PROTECTOR ULNA NERVE ~~LOC~~ (Supply) ×9 IMPLANT
SET IRR BIPOLAR W INTGTD TUBING CORD DISP SPETZLER MALIS (Tubing) ×1 IMPLANT
SOL H2O IRRIG STER 1000ML BTL (Solution) ×1
SOL SOD CHL IRRIG 1000ML BTL (Solution) ×1
SOL SOD CHL IV .9PCT 500ML BAG (Drug) ×3 IMPLANT
SOL SODIUM CHLORIDE IRRIG 1000ML BTL (Solution) ×2 IMPLANT
SOL WATER IRRIG STERILE 1000ML BTL (Solution) ×2 IMPLANT
SPONGE HEMOSTATIC GELATIN PWD SURGIFOAM 1GM (Supply) ×2 IMPLANT
SPONGE SURGIFOAM GELATIN 100 (Sponge) ×3 IMPLANT
SUTR VICRYL ANTIB O CT-1 POP 18 VILOT (Suture) ×3 IMPLANT
SYRINGE ONLY 30ML SLIP TIP (Supply) ×3 IMPLANT
TAPE ADH POROUS 3IN LTX (Supply) ×6 IMPLANT
TOOL DISS BALL 4MM 14CM (Other) IMPLANT
TOOL DISS BALL DIA 5MM 14CM (Other) IMPLANT
TOOL DISSECTING RND 5.0MM TIP (Other) ×2 IMPLANT
TUBING BIPOLAR MALIS SET (Tubing) ×3 IMPLANT

## 2017-02-09 NOTE — INTERIM OP NOTE (Signed)
Interim Op Note (Surgical Log ID: [id number])       Date of Surgery: 02/09/2017       Surgeons: Surgeon(s) and Role:     * [name], MD - Primary     * Doy Hutching, MD - Resident - Assisting       Pre-op Diagnosis: Pre-Op Diagnosis Codes:     * Cervical spondylosis with myelopathy [M47.12]       Post-op Diagnosis: Post-Op Diagnosis Codes:     * Cervical spondylosis with myelopathy [M47.12]       Procedure(s) Performed: Procedure:    C6-C7 FORAMINOTOMY, (POSTERIOR SITTING, C-Arm)  CPT(R) Code:  67893 - PR LAMINEC/FACETECT/FORAMIN,CERVICAL 1 SEG         Anesthesia Type: General        Fluid Totals: I/O this shift:  11/26 0700 - 11/26 1459  In: 1200 (10.8 mL/kg) [I.V.:1200]  Out: - (0 mL/kg)   Net: 1200  Weight: 111 kg        Estimated Blood Loss: 50 cc       Patient Condition: good       Findings (Including unexpected complications): none     Signed:  Doy Hutching, MD  on 02/09/2017 at 9:29 AM

## 2017-02-09 NOTE — Progress Notes (Signed)
Pt ambulatory. Gait steady. Voided. Medicated for pain with good effect. Gerre Pebbles RN

## 2017-02-09 NOTE — Plan of Care (Signed)
Alteration in comfort related to surgical procedure     Pain is relieved or minimized Adequate for discharge        Alteration in mobility related to anesthesia or surgery     Returning motor/sensory function Adequate for discharge        Alteration in respiratory function     Sa02 > 92% or at pre-op level Adequate for discharge        Alteration of skin integrity related to surgical procedure     Maintain skin integrity with incision line approximated Adequate for discharge        Pt neuro stable. C/O neck discomfort. Dressing cd/i. I/O adequate. Ambulating independently. VSS. Afebrile. Pt requesting discharge. Pt meets goals for discharge. All belongings returned. Pt transported to discharge. Venora Maples RN

## 2017-02-09 NOTE — Plan of Care (Signed)
Alteration in comfort related to surgical procedure     Pain is relieved or minimized Maintaining        Alteration in mobility related to anesthesia or surgery     Returning motor/sensory function Maintaining        Alteration in respiratory function     Sa02 > 92% or at pre-op level Maintaining        Alteration of skin integrity related to surgical procedure     Maintain skin integrity with incision line approximated Maintaining        Pt admitted from PACU awake and responsive. Pt ambulatory to bed. States he has some numbness left shoulder/ arm. No worse than pre-op. Reflexes bilaterally strong. Posterior neck dressing cd/i. Pt using ice for comfort. HOB elevated. VSS. Afebrile. Taking po. Oriented to unit. Family in. Questions answered. Gerre Pebbles RN

## 2017-02-09 NOTE — Progress Notes (Signed)
UPDATES TO PATIENT'S CONDITION on the DAY OF SURGERY/PROCEDURE    I. Updates to Patient's Condition (to be completed by a provider privileged to complete a H&P, following reassessment of the patient by the provider):    Day of Surgery/Procedure Update:  History  History reviewed and no change    Physical  Physical exam updated and no change            II. Procedure Readiness   I have reviewed the patient's H&P and updated condition. By completing and signing this form, I attest that this patient is ready for surgery/procedure.    III. Attestation   I have reviewed the updated information regarding the patient's condition and it is appropriate to proceed with the planned surgery/procedure.    [name], MD as of 6:17 AM 02/09/2017

## 2017-02-09 NOTE — Anesthesia Case Conclusion (Signed)
CASE CONCLUSION  Emergence  Actions:  Suctioned, soft bite block, OPA and extubated  Criteria Used for Airway Removal:  Adequate Tv & RR, acceptable O2 saturation and sustained tetany  Assessment:  Routine  Transport  Directly to: PACU  Airway:  Facemask  Oxygen Delivery:  6 lpm  Monitoring:  Pulse oximetry  Position:  Recumbent  Patient Condition on Handoff  Level of Consciousness:  Mildly sedated  Patient Condition:  Stable  Handoff Report to:  RN

## 2017-02-09 NOTE — Discharge Summary (Signed)
Name: Ronald Hughes MRN: [id number] DOB: [birth year]     Admit Date: 02/09/2017   Date of Discharge: 02/09/2017     Patient was accepted for discharge to   Home or Self Care [1]           Discharge Attending Physician: [name], MD      Hospitalization Summary    CONCISE NARRATIVE: Admitted for elective Left C 6 hemilaminectomy, C6, C7 foraminotomies. Pain well controlled. No urinary retention. Up walking without assistance.  Discharge instructions reviewed. Follow up 12/17.        OR PROCEDURE: Left C 6 hemilaminectomy, C6, C7 foraminotomies                   Signed: Jesse Sans, NP  On: 02/09/2017  at: 4:06 PM

## 2017-02-09 NOTE — Progress Notes (Signed)
OK to transfer pt. To twenty three hour unit, MD Kianpour will complete post anesthesia note. Report given to Gerre Pebbles, RN in twenty three hour unit. Pt. To transport via stretcher on 2LNC by one PCT.

## 2017-02-09 NOTE — Anesthesia Postprocedure Evaluation (Signed)
Anesthesia Post-Op Note    Patient: Ronald Hughes    Procedure(s) Performed:  Procedure Summary  Date:  02/09/2017 Anesthesia Start: 02/09/2017  7:37 AM Anesthesia Stop: 02/09/2017  9:40 AM Room / Location:  [id number] / [facility]   Procedure(s):  C6-C7 FORAMINOTOMY, (POSTERIOR SITTING, C-Arm) Diagnosis:  Cervical spondylosis with myelopathy [M47.12] Surgeon(s):  [name], MD  Doy Hutching, MD Attending Anesthesiologist:  Carren Rang, MD         Recovery Vitals  BP: 115/59 (02/09/2017 10:45 AM)  Heart Rate: 79 (02/09/2017 10:45 AM)  Heart Rate (via Pulse Ox): 78 (02/09/2017 10:45 AM)  Resp: 15 (02/09/2017 10:45 AM)  Temp: 36.6 C (97.9 F) (02/09/2017 10:30 AM)  SpO2: 97 % (02/09/2017 10:45 AM)  O2 Flow Rate: 2 L/min (02/09/2017 10:45 AM)   0-10 Scale: 4 (02/09/2017 10:45 AM)  Anesthesia type:  General  Complications Noted During Procedure or in PACU:  None   Comment:    Patient Location:  PACU  Level of Consciousness:    Recovered to baseline  Patient Participation:     Able to participate  Temperature Status:    Normothermic  Oxygen Saturation:    Within patient's normal range  Cardiac Status:   Within patient's normal range  Fluid Status:    Stable  Airway Patency:     Yes  Pulmonary Status:    Baseline  Pain Management:    Adequate analgesia  Nausea and Vomiting:  None    Post Op Assessment:    Tolerated procedure well   Attending Attestation:  All indicated post anesthesia care provided     -

## 2017-02-09 NOTE — Anesthesia Procedure Notes (Signed)
---------------------------------------------------------------------------------------------------------------------------------------    AIRWAY   GENERAL INFORMATION AND STAFF    Patient location during procedure: OR       Date of Procedure: 02/09/2017 8:18 AM  CONDITION PRIOR TO MANIPULATION     Current Airway/Neck Condition:  Normal        For more airway physical exam details, see Anesthesia PreOp Evaluation  AIRWAY METHOD     Patient Position:  Sniffing    Preoxygenated: yes      Induction: IV    Mask Difficulty Assessment:  2 - vent by mask + OPA/NPA      Mask NMB: 2 - vent by mask + OPA/NPA      Technique Used for Successful ETT Placement:  Direct laryngoscopy    Devices/Methods Used in Placement:  Intubating stylet    Blade Type:  Macintosh    Laryngoscope Blade/Video laryngoscope Blade Size:  3    Cormack-Lehane Classification:  Grade IIb - view of arytenoids or posterior of glottis only    Placement Verified by: capnometry, auscultation and equal breath sounds      Number of Attempts at Approach:  1    Number of Other Approaches Attempted:  0  FINAL AIRWAY DETAILS    Final Airway Type:  Endotracheal airway    Adjunct Airway: soft bite block    Final Endotracheal Airway:  ETT      Cuffed: cuffed    Insertion Site:  Oral    ETT Size (mm):  7.5    Distance inserted from Lips (cm):  23  ----------------------------------------------------------------------------------------------------------------------------------------

## 2017-02-09 NOTE — Discharge Instructions (Signed)
DIET:  Regular    ACTIVITY:   - Walk every 2 hours during the day.  By the end of the first month you should be walking at least 1 1/2 miles a day.  - 10 lb lifting limit the first month.  - No driving for the first week.  Then only if you are off pain medication and you can turn your neck normally.    WOUND CARE:  - The dressings should be removed on the second day after surgery and the incisions should be left open to air.  Do not use creams, lotions, or ointments on the incision.  - The incision should be kept dry for 5 days after surgery.   If you have steri strips, remove them by the 8th day following surgery.    OTHER:        Pain Medication - is prescribed for 1 week following surgery.  - You should drink plenty of fluids and use a stool softener or laxative to ensure regular bowel movements.  These are available over the counter at your pharmacy.  Do not go more than 2 days without using one of the medications to avoid significant constipation when on narcotic pain medication.    WHEN TO CALL OUR OFFICE [phone]))    - Redness, worsening pain, or swelling at or near the incision site  - Drainage from the incision  - Fever greater than 101.5 degrees  - Irritability or extreme sleepiness  - Nausea and vomiting    FOLLOW UP APPOINTMENT ONE MONTH AFTER SURGERY  Dec 17th at 10:30AM, [address], [place]

## 2017-02-09 NOTE — Progress Notes (Signed)
All discharge instructions reviewed with patient and spouse. All medications discussed. Pt will take only as needed and wean off. Home meds resumed. All restrictions and wound care understood. AVS signed by patient. Pt transported to discharge. Gerre Pebbles RN

## 2017-02-10 NOTE — Op Note (Signed)
Ronald Hughes, Ronald Hughes  MR #:  [id number]   CSN:  [id number] DOB:  1965-12-01    AGE:  51     SURGEON:  Cassell Clement, MD  ASSISTANT:  Doy Hutching, MD  SURGERY DATE:  02/09/2017    PREOPERATIVE DIAGNOSIS:    1. Cervical spondylosis.    2. Left C5-C6 foraminal stenosis.  3. Left C6-C7 foraminal stenosis.    POSTOPERATIVE DIAGNOSIS:    1. Cervical spondylosis.    2. Left C5-C6 foraminal stenosis.  3. Left C6-C7 foraminal stenosis.    OPERATIVE PROCEDURE:    1. Left C5-C6 hemilaminectomy and foraminotomy.  2. Left C6-C7 hemilaminectomy and foraminotomy.    ANESTHESIA:  General endotracheal.    ESTIMATED BLOOD LOSS:  50 cc.    FLUID TOTAL:  Per Anesthesia record.    INDICATIONS FOR THE PROCEDURE:  Ronald Hughes is a very pleasant 51 year old male with a several-month history of worsening left upper extremity numbness with head rotation in a classic C6 and C7 distribution.  His symptoms failed to respond to conservative measures.  Preoperative MRI demonstrated diffuse cervical spondylosis, most severe at C5-C6 and C6-C7.  At these levels, disk osteophyte complex formation and facet arthropathy were causing severe left-sided foraminal stenosis.  After a detailed discussion of the risks, benefits, limitations, and possible complications of procedure, the patient decided to proceed with surgery and gave informed consent.    DETAILS OF THE PROCEDURE:  The patient was brought to the operating room and placed supine on the operating table where anesthesia was induced without incident.  He was then placed in the sitting position.  His head was attached to the operating table using a modified Mayfield apparatus.  Perioperative antibiotics were administered.  All pressure points were well padded.  The posterior aspect of the neck was then shaved, marked, prepped, and draped in standard sterile fashion.      After an appropriate surgical pause confirming patient, site, and procedure, local anesthetic was infiltrated along the  incision.  The incision was opened, and meticulous hemostasis was achieved at the wound edges.  The subcutaneous tissues were divided until the fascia was reached.  The fascia was incised in the midline, and subperiosteal dissection of the paraspinal muscles was carried out along the left side of the spinous processes and left lamina of C5, C6, and C7.  A localizing x-ray was then taken to confirm these levels.      Once confirmation was achieved that these were the correct levels, the left lamina of C6 was removed piecemeal.  The inferior aspect of the left C5 lamina and the superior aspect of the left C7 lamina were removed piecemeal.  Extensive medial facetectomies were then performed at the left C5-C6 and C6-C7 facet joints.  Additional bone was removed piecemeal around the left C6 nerve root until all offending stenosis had been relieved.  Additional bone was then removed piecemeal around the left C7 nerve root until all offending stenosis had been relieved.      The wound was then copiously irrigated with saline solution.  Meticulous hemostasis was achieved with a combination of bipolar cautery and Surgifoam.  The fascia was closed in a watertight manner with interrupted 0 Vicryl suture.  The subcutaneous and subcuticular layers were closed with interrupted 2-0 Vicryl suture.  The skin was cleaned and dried, and Mastisol and Steri-Strips were applied.  A sterile dressing composed of cover sponges and Hypafix tape was applied.  The table was then converted to the supine position, and the patient was taken out of 3-point fixation, and his head was allowed to rest freely on the headrest of the table.  He was awoken and extubated in the operating room without incident.  He was transported to the recovery room in good condition.  There were no immediate complications.  All needle, sponge, and instrument counts were correct at the end of the case.  Dr. [name] was present and scrubbed for the entire  procedure.      Ronald Axe, MD    ______________________________  Cassell Clement, MD    [facility]  DD:  02/09/2017 32:20:25  DT:  02/09/2017 22:44:09  Job #:  [id number]    cc:

## 2017-02-11 ENCOUNTER — Other Ambulatory Visit: Payer: Self-pay | Admitting: Neurosurgery

## 2017-02-11 MED ORDER — DIAZEPAM 5 MG PO TABS *I*
5.0000 mg | ORAL_TABLET | Freq: Two times a day (BID) | ORAL | 0 refills | Status: AC | PRN
Start: 2017-02-11 — End: 2017-02-18

## 2017-02-12 ENCOUNTER — Other Ambulatory Visit: Payer: Self-pay | Admitting: Primary Care

## 2017-02-12 ENCOUNTER — Encounter: Payer: Self-pay | Admitting: Neurosurgery

## 2017-02-12 NOTE — Telephone Encounter (Signed)
Last apt:  01/23/2017  Next apt:  none

## 2017-02-13 MED ORDER — ESCITALOPRAM OXALATE 20 MG PO TABS *I*
20.0000 mg | ORAL_TABLET | Freq: Every day | ORAL | 3 refills | Status: DC
Start: 2017-02-13 — End: 2017-11-16

## 2017-02-17 ENCOUNTER — Other Ambulatory Visit: Payer: Self-pay | Admitting: Neurosurgery

## 2017-02-17 MED ORDER — CYCLOBENZAPRINE HCL 10 MG PO TABS *I*
10.0000 mg | ORAL_TABLET | Freq: Every evening | ORAL | 0 refills | Status: DC | PRN
Start: 2017-02-17 — End: 2017-03-15

## 2017-02-17 NOTE — Progress Notes (Signed)
Patient well known to neurosurgery and per patient request to Dr. Maryfrances Bunnell.

## 2017-02-19 ENCOUNTER — Other Ambulatory Visit: Payer: Self-pay | Admitting: Primary Care

## 2017-02-19 MED ORDER — METHYLPHENIDATE HCL 30 MG PO CP24 *A*
30.0000 mg | ORAL_CAPSULE | Freq: Every day | ORAL | 0 refills | Status: DC
Start: 2017-02-19 — End: 2017-05-22

## 2017-02-19 NOTE — Telephone Encounter (Signed)
I stop checked, Last seen 01-23-17    02/11/2017 02/11/2017 diazepam 5 mg tablet  10 5 [place], [place] #[id number]  02/09/2017 02/09/2017 oxycodone hcl 5 mg tablet  56 7 Jesse Sans [facility] NP [place] #[id number]  11/24/2016 11/24/2016 methylphenidate er 30 mg cap  90 90 [place], [place] [phone]  08/21/2016 08/26/2016 methylphenidate er 30 mg cap  90 90 Va[place], [place] [phone]

## 2017-02-21 ENCOUNTER — Other Ambulatory Visit: Payer: Self-pay | Admitting: Primary Care

## 2017-02-23 NOTE — Telephone Encounter (Signed)
LOV 11.9.18  NOV NONE

## 2017-03-02 ENCOUNTER — Encounter: Payer: Self-pay | Admitting: Neurosurgery

## 2017-03-02 ENCOUNTER — Ambulatory Visit: Payer: PRIVATE HEALTH INSURANCE | Attending: Primary Care | Admitting: Neurosurgery

## 2017-03-02 VITALS — BP 159/92 | HR 84 | Ht 68.0 in | Wt 244.0 lb

## 2017-03-02 DIAGNOSIS — M50323 Other cervical disc degeneration at C6-C7 level: Secondary | ICD-10-CM

## 2017-03-02 NOTE — Progress Notes (Signed)
Post Op Visit Following Surgery on 02/09/17 Left C 5-6, C 6-7 hemilaminotomies with C6, C7 foraminotomies            Interim History:For the first 10 days post op he had significant neck pain. We recommended a soft cervical collar for comfort.  Today he denies pain.  He has slight numbness in the left index finger, the numbness in the thumb has resolved.      Exam:   Motor exam intact  Sensory exam intact  DTR's KJ 2/2, AJ 2/2  CROM mild restriction on extension due to stiffness  Incision healing well.  No weakness on toe and heel walking    Plan:   He has a 30 lb lifting restriction for the next month. I gave him a demonstration of cervical and upper extremity exercises to perform.  He needs to be conscious of correct posture to prevent his kyphosis from worsening.  I reviewed the signs of cervical myelopathy with him if he develops stenosis in the future.  We will follow up with him prn.

## 2017-03-15 ENCOUNTER — Other Ambulatory Visit: Payer: Self-pay | Admitting: Neurosurgery

## 2017-04-24 ENCOUNTER — Other Ambulatory Visit: Payer: Self-pay | Admitting: Neurosurgery

## 2017-05-09 ENCOUNTER — Ambulatory Visit
Admission: AD | Admit: 2017-05-09 | Discharge: 2017-05-09 | Disposition: A | Discharge status: Home / Self Care | Payer: PRIVATE HEALTH INSURANCE | Source: Ambulatory Visit | Attending: Emergency Medicine | Admitting: Emergency Medicine

## 2017-05-09 ENCOUNTER — Ambulatory Visit
Admit: 2017-05-09 | Discharge: 2017-05-09 | Disposition: A | Discharge status: Home / Self Care | Payer: PRIVATE HEALTH INSURANCE

## 2017-05-09 DIAGNOSIS — M25562 Pain in left knee: Secondary | ICD-10-CM

## 2017-05-09 DIAGNOSIS — M25462 Effusion, left knee: Secondary | ICD-10-CM | POA: Insufficient documentation

## 2017-05-09 NOTE — ED Triage Notes (Signed)
Hit L knee on side of a bedpost and now has constant pain.  Swollen, painful ambulation.  No previous injuries.         Triage Note   Diona Foley, RN

## 2017-05-09 NOTE — Discharge Instructions (Signed)
Recommend rest, ice, elevation and compression of the left knee.    Recommend Ibuprofen 600 mg 3 times daily with food as needed for pain.

## 2017-05-09 NOTE — UC Provider Note (Signed)
History     Chief Complaint   Patient presents with    Knee Pain     Hit L knee on side of a bedpost and now has constant pain.  Swollen, painful ambulation.  No previous injuries.         History provided by:  Patient  Language interpreter used: No    Knee Pain   Location:  Knee  Time since incident:  5 hours  Injury: yes    Mechanism of injury comment:  Patient struck his left knee up against the edge of the bed frame twice at home today  Knee location:  L knee  Pain details:     Quality:  Aching    Radiates to:  Does not radiate    Severity:  Moderate    Onset quality:  Gradual    Timing:  Constant    Progression:  Worsening  Chronicity:  New  Prior injury to area:  No  Relieved by:  None tried  Worsened by:  Bearing weight and activity  Associated symptoms: decreased ROM, stiffness and swelling    Associated symptoms: no fatigue, no fever, no itching, no muscle weakness, no numbness and no tingling        Medical/Surgical/Family History     Past Medical History:   Diagnosis Date    ADD (attention deficit disorder)     Anxiety     Depression     Dry eye syndrome     High blood pressure     Hypertension     Other cervical disc degeneration at C6-C7 level 02/09/2017    Sleep apnea         Patient Active Problem List   Diagnosis Code    Anxiety F41.9    Depression F32.9    ADD (attention deficit disorder) F98.8    Unspecified essential hypertension I10    Obesity E66.9    OSA (obstructive sleep apnea) G47.33    Erectile dysfunction N52.9    Other cervical disc degeneration at C6-C7 level M50.323            Past Surgical History:   Procedure Laterality Date    CERVICAL SPINE SURGERY Left 02/09/2017    C 6-7 foraminotomies    PR LAMINEC/FACETECT/FORAMIN,CERVICAL 1 SEG Left 02/09/2017    Procedure: C6-C7 FORAMINOTOMY, (POSTERIOR SITTING, C-Arm);  Surgeon: [name], MD;  Location: [facility] MAIN OR;  Service: Neurosurgery     Family History   Problem Relation Age of Onset    Cancer Mother          lung    Arthritis Father     Cancer Father         renal    Dementia Father 53    Cancer Maternal Grandmother         brain    Cancer Maternal Grandfather         lung    No Known Problems Brother     Arthritis Brother         Back, knee    No Known Problems Paternal Grandmother     Parkinsonism Paternal Grandfather     No Known Problems Maternal Aunt     No Known Problems Maternal Uncle     No Known Problems Paternal Aunt     No Known Problems Paternal Uncle     Cataracts Neg Hx     Diabetes Neg Hx     Glaucoma Neg Hx     Macular degeneration  Neg Hx           Social History   Substance Use Topics    Smoking status: Former Smoker     Types: Cigars     Quit date: 10/15/2015    Smokeless tobacco: Never Used      Comment: 1 cigars a day    Alcohol use Yes      Comment: 2-3/ week     Living Situation     Questions Responses    Patient lives with     Homeless     Caregiver for other family member     External Services     Employment     Domestic Violence Risk                 Review of Systems   Review of Systems   Constitutional: Negative for fatigue and fever.   Musculoskeletal: Positive for arthralgias (left knee), gait problem (limping), joint swelling (left knee) and stiffness.   Skin: Negative for color change, itching and wound.       Physical Exam   Triage Vitals  Triage Start: Start, (05/09/17 2113)   First Recorded BP: 131/71, Resp: 15, Temp: 37.3 C (99.1 F), Temp src: TEMPORAL Oxygen Therapy SpO2: 96 %, Oximetry Source: Rt Hand, O2 Device: None (Room air), Heart Rate: 86, (05/09/17 2114)  .      Physical Exam   Constitutional: He is oriented to person, place, and time. He appears well-developed and well-nourished. No distress.   HENT:   Head: Normocephalic and atraumatic.   Cardiovascular: Intact distal pulses.    Pulmonary/Chest: Effort normal.   Musculoskeletal:        Left knee: He exhibits decreased range of motion, swelling and effusion. He exhibits no ecchymosis, no deformity, no  bony tenderness and normal meniscus. Tenderness found. Lateral joint line and LCL tenderness noted.   Ambulating with a limp.   Neurological: He is alert and oriented to person, place, and time.   Skin: Skin is warm and dry.   Psychiatric: He has a normal mood and affect. His behavior is normal. Judgment and thought content normal.   Nursing note and vitals reviewed.       Medical Decision Making      Amount and/or Complexity of Data Reviewed  Tests in the radiology section of CPT: ordered and reviewed  Independent visualization of images, tracings, or specimens: yes        Initial Evaluation:  ED First Provider Contact     Date/Time Event User Comments    05/09/17 2052 ED First Provider Contact Lonell Face M Initial Face to Face Provider Contact          Patient was seen on: 05/09/2017        Assessment:  52 y.o.male comes to the [facility] with left knee pain, stiffness, swelling and limping x 4 hours due to injury twice in which he struck it on the edge of a bed frame at home.     Differential Diagnosis includes:  Meniscal injury, Contusion, Tibial Fracture, Ligamentous sprain, Tendon Strain, Septic Arthritis, Bursitis, Baker's Cyst.     Plan:   Orders Placed This Encounter    ED/UC Knee Sleeve - Open Patella    * Knee LEFT standard AP, Lateral, Patellar views    ED/UC REFERRAL TO ORTHO    Please Provide Supply: Knee Sleeve       No results found for this or any previous visit (from  the past 24 hour(s)).     Orders Placed This Encounter   Procedures    ED/UC Knee Sleeve - Open Patella     Standing Status:   Standing     Number of Occurrences:   1    * Knee LEFT standard AP, Lateral, Patellar views     Standing Status:   Standing     Number of Occurrences:   1     Order Specific Question:   Where should test be performed?     Answer:   [facility]     Order Specific Question:   Signs and symptoms/indications     Answer:   pain, stiffness, swelling laterally x 5 hours due to injury (struck against  frame of bed twice at home)    ED/UC REFERRAL TO ORTHO     Referral Priority:   Urgent     Referral Type:   Referral     Referral Reason:   Consult and treat     Number of Visits Requested:   1    Please Provide Supply: Knee Sleeve     Standing Status:   Standing     Number of Occurrences:   1     Order Specific Question:   Please Provide Supply:     Answer:   Knee Sleeve       Xrays ordered and reviewed - no obvious acute fracture or dislocation; official radiology read pending at time of discharge, any deviation from plan at time of discharge will be conveyed telephonically.     Final Diagnosis    ICD-10-CM ICD-9-CM   1. Knee effusion, left M25.462 719.06       Encourage fluids, encourage rest, good hand hygiene.    Use over the counter medications as discussed. Recommend Ibuprofen 600 mg 3 times daily with food as needed for pain.     Recommend rest, ice, elevation and compression of the left knee.    Work note given - see letter in [employer].      Please follow up with your physician as below:    Follow-up Information     Bea Graff, MD In 2 days.    Specialties:  Primary Care, Internal Medicine  Why:  As needed  Contact information:  [place]  [place] [address]  [phone]                 If short of breath, chest pains, worsening symptoms, or any other concerns please report to the emergency room.    In the event of an Emergency dial 911.          Final Diagnosis  Final diagnoses:   [T03.546] Knee effusion, left (Primary)         Andris Flurry, PA              Lonell Face [place], [place]  05/09/17 2140

## 2017-05-22 ENCOUNTER — Other Ambulatory Visit: Payer: Self-pay | Admitting: Primary Care

## 2017-05-22 MED ORDER — METHYLPHENIDATE HCL 30 MG PO CP24 *A*
30.0000 mg | ORAL_CAPSULE | Freq: Every day | ORAL | 0 refills | Status: DC
Start: 2017-05-22 — End: 2017-08-17

## 2017-05-22 MED ORDER — LOSARTAN POTASSIUM 50 MG PO TABS *I*
50.0000 mg | ORAL_TABLET | Freq: Every day | ORAL | 3 refills | Status: DC
Start: 2017-05-22 — End: 2017-11-16

## 2017-05-22 NOTE — Telephone Encounter (Signed)
I stop checked, Last seen 01-23-17

## 2017-05-28 ENCOUNTER — Other Ambulatory Visit: Payer: Self-pay | Admitting: Neurosurgery

## 2017-06-19 ENCOUNTER — Other Ambulatory Visit: Payer: Self-pay | Admitting: Neurosurgery

## 2017-06-19 MED ORDER — CYCLOBENZAPRINE HCL 10 MG PO TABS *I*
10.0000 mg | ORAL_TABLET | Freq: Every day | ORAL | 1 refills | Status: DC
Start: 2017-06-19 — End: 2018-03-08

## 2017-08-17 ENCOUNTER — Other Ambulatory Visit: Payer: Self-pay | Admitting: Primary Care

## 2017-08-17 MED ORDER — METHYLPHENIDATE HCL 30 MG PO CP24 *A*
30.0000 mg | ORAL_CAPSULE | Freq: Every day | ORAL | 0 refills | Status: DC
Start: 2017-08-17 — End: 2017-11-16

## 2017-08-17 NOTE — Telephone Encounter (Signed)
I stop completed  Last visit 01/23/17  No future visits scheduled  Labs 01/23/17

## 2017-08-18 ENCOUNTER — Other Ambulatory Visit: Payer: Self-pay | Admitting: Primary Care

## 2017-08-18 NOTE — Telephone Encounter (Signed)
Last seen 01-23-17, nothing sched

## 2017-11-16 ENCOUNTER — Other Ambulatory Visit: Payer: Self-pay | Admitting: Primary Care

## 2017-11-17 MED ORDER — ESCITALOPRAM OXALATE 20 MG PO TABS *I*
20.0000 mg | ORAL_TABLET | Freq: Every day | ORAL | 3 refills | Status: DC
Start: 2017-11-17 — End: 2018-11-03

## 2017-11-17 MED ORDER — METHYLPHENIDATE HCL 30 MG PO CP24 *A*
30.0000 mg | ORAL_CAPSULE | Freq: Every day | ORAL | 0 refills | Status: DC
Start: 2017-11-17 — End: 2018-02-15

## 2017-11-17 MED ORDER — LOSARTAN POTASSIUM 50 MG PO TABS *I*
50.0000 mg | ORAL_TABLET | Freq: Every day | ORAL | 3 refills | Status: DC
Start: 2017-11-17 — End: 2018-05-24

## 2017-11-17 NOTE — Telephone Encounter (Signed)
Last seen 01-23-17, nothing scheduled  I stop checked

## 2018-02-15 ENCOUNTER — Encounter: Payer: Self-pay | Admitting: Primary Care

## 2018-02-15 ENCOUNTER — Other Ambulatory Visit: Payer: Self-pay | Admitting: Primary Care

## 2018-02-15 MED ORDER — METHYLPHENIDATE HCL 30 MG PO CP24 *A*
30.0000 mg | ORAL_CAPSULE | Freq: Every day | ORAL | 0 refills | Status: DC
Start: 2018-02-15 — End: 2018-04-29

## 2018-02-15 NOTE — Telephone Encounter (Signed)
Due for a follow up visit for med.

## 2018-02-15 NOTE — Telephone Encounter (Signed)
Last seen 01-23-17, nothing sched  I stop checked

## 2018-02-16 NOTE — Telephone Encounter (Signed)
I left a message to call the office back.

## 2018-02-17 NOTE — Telephone Encounter (Signed)
I spoke to the patient and scheduled him for 03-08-18 @ 9:45 am.

## 2018-02-24 ENCOUNTER — Encounter: Payer: PRIVATE HEALTH INSURANCE | Admitting: Primary Care

## 2018-03-08 ENCOUNTER — Ambulatory Visit: Payer: PRIVATE HEALTH INSURANCE | Attending: Primary Care | Admitting: Primary Care

## 2018-03-08 VITALS — BP 130/100 | HR 66 | Ht 68.0 in | Wt 240.0 lb

## 2018-03-08 DIAGNOSIS — Z23 Encounter for immunization: Secondary | ICD-10-CM

## 2018-03-08 DIAGNOSIS — F419 Anxiety disorder, unspecified: Secondary | ICD-10-CM

## 2018-03-08 DIAGNOSIS — I10 Essential (primary) hypertension: Secondary | ICD-10-CM

## 2018-03-08 DIAGNOSIS — F988 Other specified behavioral and emotional disorders with onset usually occurring in childhood and adolescence: Secondary | ICD-10-CM

## 2018-03-08 MED ORDER — HYDROCHLOROTHIAZIDE 12.5 MG PO CAPS *I*
12.5000 mg | ORAL_CAPSULE | Freq: Every morning | ORAL | 5 refills | Status: DC
Start: 2018-03-08 — End: 2018-03-30

## 2018-03-08 NOTE — Progress Notes (Signed)
Patient presents today for...  Lost 20 pound in [place], mindful eating and exercise. Put it all back on. Gym 2-3 days a week.     ADD-on the adderall, he feels he doesn't have issues with concentrating, if he missed a dose he does notice a lack of focus. Those days are a struggle.     No anxiety issues, on the lexapro, mood is good, he feels that there is no negative side effects, better than the Wellbutrin.     S/p cervical laminectomy Nov 2018, doing well. Maybe just a little numbness in the thumb, but noting like what it was.     MEDICAL PROBLEMS  Patient Active Problem List   Diagnosis Code    Anxiety F41.9    Depression F32.9    ADD (attention deficit disorder) F98.8    Unspecified essential hypertension I10    Obesity E66.9    OSA (obstructive sleep apnea) G47.33    Erectile dysfunction N52.9    Other cervical disc degeneration at C6-C7 level M50.323       CURRENT ALLERGIES  Patient has no known allergies (drug, envir, food or latex).    CURRENT MEDICATIONS  Current Outpatient Medications   Medication Sig    methylphenidate (RITALIN LA) 30 MG 24 hr capsule Take 1 capsule (30 mg total) by mouth daily (before breakfast) Max daily dose: 30 mg MDD 40 mg  Code B    escitalopram (LEXAPRO) 20 MG tablet Take 1 tablet (20 mg total) by mouth daily    losartan (COZAAR) 50 MG tablet Take 1 tablet (50 mg total) by mouth daily    fenofibrate (TRICOR) 48 MG tablet TAKE 1 TABLET (48 MG TOTAL) BY MOUTH DAILY    auto-titrating CPAP (AUTOSET) machine S10 AutoCPAP with min pres of 5 cm-max pres of 18 cm. Dispense with humidifier,heated tubing, & all required equipment. Duration: lifetime     No current facility-administered medications for this visit.        The patients medications and allergies were reviewed, reconciled as needed and no changes were made.    EXAMINATION  Vitals:    03/08/18 1000   BP: (!) 130/100   Pulse: 66   SpO2: 99%   Weight: 108.9 kg (240 lb)   Height: 1.727 m (5\' 8" )     BP Readings from Last 4  Encounters:   03/08/18 (!) 130/100   05/09/17 131/71   03/02/17 (!) 159/92   02/09/17 111/62     Last weight   03/08/18 108.9 kg (240 lb)   03/02/17 110.7 kg (244 lb)   02/09/17 111 kg (244 lb 11.4 oz)   01/23/17 109.8 kg (242 lb)     Body mass index is 36.49 kg/m.  Last 4 Weights    03/08/18 1000   Weight: 108.9 kg (240 lb)     Carotids are without bruits.  Thyroid feels normal.  Lungs are clear to auscultation no wheeze rale or rub.  3 exams a regular rate and rhythm normal S1-S2 no murmurs.  No flank bruits.  No lower extremity edema.        IMPRESSION/PLAN   1.  Hypertension-not to goal.  Recommended adding in hydrochlorothiazide 12.5 mg daily.  If he is successful weight loss and exercise and pressure drops we can remove it in the future.  He'll check blood pressure at school nurse calls with numbers although did ask him to stop in for a check here to document response.  2.  Anxiety-he feels  Lexapro current dose is good without side effects and control symptoms.  No changes suggested.  3.  ADD-he does feel he benefits from the medicine any feels got performances affected without.  If it is contributed some to his blood pressure he would prefer to just treat around that as we have been.  Therefore every month current dose the Adderall and blood pressure med adjustment as above.

## 2018-03-19 ENCOUNTER — Ambulatory Visit: Payer: PRIVATE HEALTH INSURANCE | Attending: Optometry | Admitting: Optometry

## 2018-03-19 ENCOUNTER — Encounter: Payer: Self-pay | Admitting: Optometry

## 2018-03-19 DIAGNOSIS — H524 Presbyopia: Secondary | ICD-10-CM

## 2018-03-19 DIAGNOSIS — H5213 Myopia, bilateral: Secondary | ICD-10-CM

## 2018-03-19 DIAGNOSIS — H52223 Regular astigmatism, bilateral: Secondary | ICD-10-CM

## 2018-03-19 DIAGNOSIS — H5021 Vertical strabismus, right eye: Secondary | ICD-10-CM

## 2018-03-19 NOTE — Progress Notes (Signed)
Assessment:       1. Myopia, bilateral  2. Regular astigmatism, bilateral  3. Presbyopia  - optional spec rx update.  - order trials of B+L Ultra Toric MF and return for dispensing.  Ronald Hughes will assess and report preference.  If suboptimal, return to existing [employer] with reading glasses.  - OptiFree MPS hs and monthly replacement.  - exam with dilation annually, sooner if sx warrant.    4. Hypertropia of right eye  - self-reported episodic diplopia in extreme gaze for past 6-8 months.  - consult with Ronald Hughes or [name] to evaluate possible 4th nerve palsy.  - [place] will consult his appointment book and call for follow-up visit within next month.  - follow-up pending strabismus consult.           Plan:     1. See above.

## 2018-03-19 NOTE — Progress Notes (Signed)
Patient: Baranowski, Mr. Margaret Staggs  Date of Birth: [birth year]  Exam#: [id number]  Exam Date: 08/31/2012  Provider: Peggyann Juba  REASON FOR VISIT  EXAMINATION: Comprehensive Ophthalmic Exam.  EXAM TECHNICIAN: Angelique Holm  PRIMARY CARE PHYSICIAN: Bea Graff, MD  OCCUPATION: Assist. Director  CHIEF COMPLAINT  CHIEF COMPLAINT: Pt. feels both DV and NV are worse, but near definetly is more noticeable, (+)order CL's,  (+) very occasional floaters (-)flashes, No reported pain or irritation.  HISTORY PRESENT ILLNESS (HPI)  HISTORY OF PRESENT ILLNESS: Not experiencing routine headaches or double vision. No reports of light  flashes.  CONTACT LENSES: Bilateral: AWT: 12hr / TWT: 3.5hr / Soln: [employer] MP / Repl: 72mo  PATIENT HISTORY  OCULAR HISTORY: OU: Corneal Neovascularization, OU: Mild GPC, OU: Dry Eye Syndrome.  MEDICAL HISTORY: ADD/ADHD.  SYSTEMIC SURGICAL HISTORY: No pertinent surgical history exists.  SYSTEMIC FAMILY HISTORY: No pertinent medical history exists.  OCULAR SURGICAL HISTORY: None.  OCULAR FAMILY HISTORY: Family ocular history is reported to be unremarkable.  OCULAR MEDICATIONS: No known ocular medication allergies, No reported ocular medications.  SYSTEMIC MEDICATIONS: Ritalin, No known systemic medication allergies.  SOCIAL HISTORY: Former smoker (CDC 3).  REVIEWED HISTORY: I have reviewed this patient's previous exam records. I have reviewed this patient's history  encounter form. Hx verified. 08/31/12. ard  REVIEW OF SYSTEMS  REVIEW OF SYSTEMS: No reported disorders or current medical treatment of: Allergy Cardiovascular  Constitutional Ears,nose,mouth,throat Endocrine Gastrointestinal Genitourinary Hematologic / Lymphatic  Immunologic Integumentary / Skin Musculoskeletal Neurologic Psychiatric Respiratory Unless otherwise noted  below.  PRESENTING FINDINGS  PRESENTING SPECTACLE Rx: (Distance Single Vision)  OD: -5.00 -1.25 x 161 DVA: 20/30-  OS: -5.50 -1.75 x 179 DVA: 20/25  OU: DVA:  20/20-  PRESENTING CONTACT Rx: (#1)  OD: Aquaclear 100 Toric -5.00 -0.75 x 010 BC: 8.50 Dia: 14.50 DVA: 20/25 - NVA: 20/30 - Handling Tint  OS: Aquaclear 100 Toric -5.00 -1.75 x 010 BC: 8.50 Dia: 14.50 DVA: 20/20 - NVA: 20/30 Handling Tint  OU: DVA: 20/20 - NVA: 20/25  VISION  SPHERICAL OVER REFRACTION OBSERVATIONS: Over presenting contacts  MANIFEST:  OD: -5.75 -1.00 x 005 Add: +1.25 DVA: 20/20 NVA: 20/20  OS: -5.75 -2.00 x 010 Add: +1.25 DVA: 20/20 NVA: 20/20  OU: DVA: 20/20 NVA: 20/20  FINAL SPECTACLE Rx:  OD: -5.75 -1.00 x 005 Add: +1.25 DVA: 20/20 NVA: 20/20  OS: -5.75 -2.00 x 010 Add: +1.25 DVA: 20/20 NVA: 20/20  OU: DVA: 20/20 NVA: 20/20  TRIAL CONTACT LENS Rx: #1  OD: Aquaclear 100 Toric Trial -4.75 -0.75 x 010 BC: 8.50 Dia: 14.50 Handling Tint  OS: Aquaclear 100 Toric Trial -4.75 -1.75 x 010 BC: 8.50 Dia: 14.50 Handling Tint  SPHERE OVER-REFRACTION Rx: #1  OD: +0.50 DVA: 20/20 -  OS: +0.50 DVA: 20/20 -  FINAL CONTACT LENS Rx: (#1)  OD: Aquaclear 100 Toric -4.75 -0.75 x 010 BC: 8.50 Dia: 14.50 Handling Tint  OS: Aquaclear 100 Toric -4.75 -1.75 x 010 BC: 8.50 Dia: 14.50 Handling Tint  EXAMINATION  CUP/DISC RATIO:  OD: [place] .[place] .30  OS: Horz .[place] .30  TONOMETRY: OD: 12 mmHg OS: 14 mmHg Test: ICT Time: 11:37 Category: Pre-Test  DILATION ORDERS: Paremyd, OU @11 :37 AM  CONFRONTATION FIELDS OBSERVATIONS: Fields were found to be full in all quadrants, OU  EXTRAOCULAR MUSCLES: Bilateral: Ocular motility assessment; full and unrestricted.  EYELIDS: Bilateral: Eyelashes and eyelids normal, w/o blepharitis or MGD. Good lid position and closure. (-)GPC.  PUPILS: Bilateral: Pupils are equally round, reactive to direct, consensual and near stimulation. No afferent  pupillary defect is noted.  CORNEA: Bilateral: Corneal epithelium, stroma, endothelium, tear film, clear and healthy; (-)NV. 1/2 mm Neo 360*.  TBUT: , 10 sec.  CONTACT LENS EVALUATION: Bilateral: Each Aquaclear toric fits well without  rotation.  CONJUNCTIVA: Bilateral: Bulbar and palpebral conjunctiva are healthy and white.  IRIS: Bilateral: The iris appears healthy with normal anatomy and convexity.  LENS: Bilateral: Lens, both capsules, cortex, and nucleus are clear/normal for age.  ANTERIOR CHAMBER: Bilateral: Josiah Lobo are deep with no evidence of cells or flare.  VITREOUS: Bilateral: The vitreous is normal. No cells in Vitreous are noted.  OPTIC NERVE: Bilateral: Optic disc appears normal. Disc margin is distinct, NRR P&S.  MACULA: Bilateral: Macula appears flat with normal pigment, (+)FR.  CHOROID: Bilateral: Choroid appears flat and normal.  RETINA: Bilateral: Posterior pole clear, periphery flat 360* w/o breaks.  RETINA - VASCULAR: Bilateral: Retinal vasculature appears normal, ratio 2/3, (+)SVP.  DISPOSITION: Patient is pleasant and sociable.  ORIENTATION: Patient is fully alert to time, place and person.  IMPRESSION(S):  Bilateral: Dry eye syndrome (contact lens related)  Myopia  Astigmatism  Presbyopia  PLAN  TREATMENT DRY EYE: Bilateral: Continue with hot compress, good hydration, and good dietary omega 3s qd.  PATIENT MANAGEMENT  COUNSELING: Counseling has been provided to review this patient's case and discuss options for treatment.  Kadden understands limitations of toric contact lenses given his incipient presbyopia. We'll try to optimize his contact  lens Rx and Jlynn will try +100 OTC prn for near. If desired, he can return for monovision refit with JJB or Val.  ORDERS:  Schedule on or about 08/31/2013: Examination: Comprehensive Established Patient Visit Ordered by: Peggyann Juba. Entered by: Peggyann Juba,  ELECTRONIC SIGNATURE: Electronically Signed By: Evans Lance, OD on 08/31/2012 12:07 PM.  NOTES: 11/02/2012 Pt called to order lenses. Pt prefers Aquaclear 100 Toric  OD: -4.75-0.75x010 OS: -4.75-1.75x010. -Dani Date-time: 11/02/2012 10:17:31 AM By: Dyke Maes  DIAGNOSIS:  367.1 Myopia  367.21  Astigmatism, Regular  367.4 Presbyopia  PROCEDURE:  92014 Comprehensive Ocular Exam, Established Patient  [phone] Refractive Analysis

## 2018-03-30 ENCOUNTER — Other Ambulatory Visit: Payer: Self-pay | Admitting: Primary Care

## 2018-03-30 MED ORDER — HYDROCHLOROTHIAZIDE 12.5 MG PO CAPS *I*
12.5000 mg | ORAL_CAPSULE | Freq: Every morning | ORAL | 3 refills | Status: DC
Start: 2018-03-30 — End: 2018-04-29

## 2018-04-27 ENCOUNTER — Encounter: Payer: Self-pay | Admitting: Optometry

## 2018-04-27 ENCOUNTER — Ambulatory Visit: Payer: PRIVATE HEALTH INSURANCE | Attending: Optometry | Admitting: Optometry

## 2018-04-27 VITALS — Ht 68.0 in | Wt 240.0 lb

## 2018-04-27 DIAGNOSIS — H5021 Vertical strabismus, right eye: Secondary | ICD-10-CM

## 2018-04-27 DIAGNOSIS — H52223 Regular astigmatism, bilateral: Secondary | ICD-10-CM

## 2018-04-27 DIAGNOSIS — H524 Presbyopia: Secondary | ICD-10-CM

## 2018-04-27 DIAGNOSIS — H5213 Myopia, bilateral: Secondary | ICD-10-CM

## 2018-04-27 NOTE — Progress Notes (Addendum)
Assessment:       1. Myopia, bilateral  2. Regular astigmatism, bilateral  3. Presbyopia  - dispense trials of b+l ultra toric multifocal cl ou.  ReNu qhs.  Ronald Hughes to call with preference in 3-4 weeks.  - annual exam with dilation, sooner if sx warrant.    4. Hypertropia of right eye  - self-reported episodic diplopia in extreme gaze for past 6-8 months.  - consult with Dr Ronald Hughes or Ronald Hughes to evaluate possible 4th nerve palsy.  - Ronald Hughes will consult his appointment book and call for follow-up visit within next month.  - follow-up pending strabismus consult.           Plan:     1. See above.       10-15-18  Ronald Hughes called today.  After initially feeling as though the Ultra Toric MF was not working well, he is now doing well and prefers to order.  I have finalized the Rx and Ronald Hughes will follow-up with Ronald Hughes.

## 2018-04-29 ENCOUNTER — Telehealth: Payer: Self-pay | Admitting: Primary Care

## 2018-04-29 ENCOUNTER — Ambulatory Visit: Payer: PRIVATE HEALTH INSURANCE | Attending: Primary Care | Admitting: Primary Care

## 2018-04-29 ENCOUNTER — Encounter: Payer: Self-pay | Admitting: Primary Care

## 2018-04-29 VITALS — BP 136/88 | HR 59 | Ht 68.0 in | Wt 238.0 lb

## 2018-04-29 DIAGNOSIS — I10 Essential (primary) hypertension: Secondary | ICD-10-CM

## 2018-04-29 DIAGNOSIS — Z0000 Encounter for general adult medical examination without abnormal findings: Secondary | ICD-10-CM

## 2018-04-29 MED ORDER — METHYLPHENIDATE HCL 20 MG PO CP24 *I*
20.0000 mg | ORAL_CAPSULE | Freq: Every day | ORAL | 0 refills | Status: DC
Start: 2018-04-29 — End: 2018-06-18

## 2018-04-29 MED ORDER — HYDROCHLOROTHIAZIDE 25 MG PO TABS *I*
25.0000 mg | ORAL_TABLET | Freq: Every morning | ORAL | 5 refills | Status: DC
Start: 2018-04-29 — End: 2018-05-21

## 2018-04-29 NOTE — Telephone Encounter (Signed)
Physical scheduled May 20, 2018.  Will need a reminder call go few days before appointment.  Needs to be fasting 12 hours, may have water.

## 2018-04-29 NOTE — Telephone Encounter (Signed)
signed

## 2018-04-29 NOTE — Telephone Encounter (Signed)
Patient notified

## 2018-04-29 NOTE — Progress Notes (Signed)
Patient presents today for...  HTN-we added in the hctz, he has been checking at home. Getting 127/92, but also 130/99. Yesterday at work he was 144/100.  Anxiety is chronically elevated but that's due to just being in a stressful work environment.  He works [employer].  We also discussed the possibility that his ADD medicine might contributing to his high blood pressure.  He does find it helpful for focus but given the resistance of his pressure coming down despite as adding some medications in might want to consider that.    MEDICAL PROBLEMS  Patient Active Problem List   Diagnosis Code    Anxiety F41.9    Depression F32.9    ADD (attention deficit disorder) F98.8    Unspecified essential hypertension I10    Obesity E66.9    OSA (obstructive sleep apnea) G47.33    Erectile dysfunction N52.9    Other cervical disc degeneration at C6-C7 level M50.323       CURRENT ALLERGIES  Patient has no known allergies (drug, envir, food or latex).    CURRENT MEDICATIONS  Current Outpatient Medications   Medication Sig    hydroCHLOROthiazide (MICROZIDE) 12.5 MG capsule Take 1 capsule (12.5 mg total) by mouth every morning    methylphenidate (RITALIN LA) 30 MG 24 hr capsule Take 1 capsule (30 mg total) by mouth daily (before breakfast) Max daily dose: 30 mg MDD 40 mg  Code B    escitalopram (LEXAPRO) 20 MG tablet Take 1 tablet (20 mg total) by mouth daily    losartan (COZAAR) 50 MG tablet Take 1 tablet (50 mg total) by mouth daily    fenofibrate (TRICOR) 48 MG tablet TAKE 1 TABLET (48 MG TOTAL) BY MOUTH DAILY    auto-titrating CPAP (AUTOSET) machine S10 AutoCPAP with min pres of 5 cm-max pres of 18 cm. Dispense with humidifier,heated tubing, & all required equipment. Duration: lifetime     No current facility-administered medications for this visit.        The patients medications and allergies were reviewed, reconciled as needed and no changes were made.    EXAMINATION  Vitals:    04/29/18 0838   BP: (!)  134/100   Pulse: 59   SpO2: 99%   Weight: 108 kg (238 lb)   Height: 1.727 m (5\' 8" )     BP Readings from Last 4 Encounters:   04/29/18 (!) 134/100   03/08/18 (!) 130/100   05/09/17 131/71   03/02/17 (!) 159/92     Last weight   04/29/18 108 kg (238 lb)   04/27/18 108.9 kg (240 lb)   03/08/18 108.9 kg (240 lb)   03/02/17 110.7 kg (244 lb)     Body mass index is 36.19 kg/m.  Last 4 Weights    04/29/18 0838   Weight: 108 kg (238 lb)     Recheck blood pressure second recheck showed a diastolic about 96 and we did a third it was 90 and the fourth was 88.  Systolic rose about 696.  Lungs are clear and auscultation no wheeze rale or rub.  Card exam shows a regular rate and rhythm normal S1-S2.        IMPRESSION/PLAN   Essential hypertension-although he can get him to goal with prolonged sitting and relaxation that's not realistic he's under a lot of stress and very active throughout the day so I do think we should probably up his medicine a little bit.  We'll move the hydrochlorothiazide 25 mg.  I also want to try backing off on the Adderall to 20 mg to see if that helps keep his more consistently in the normal range.

## 2018-05-13 ENCOUNTER — Telehealth: Payer: Self-pay | Admitting: Primary Care

## 2018-05-13 NOTE — Telephone Encounter (Signed)
Patient will send them in my chart , he wil get a few mor days.

## 2018-05-13 NOTE — Telephone Encounter (Signed)
No he can just send me readings.

## 2018-05-13 NOTE — Telephone Encounter (Signed)
Patient called to Rescheduled physical/HTN apt on 05/20/18, rescheduled for next available physical spot on 09/07/18. Will provider want a sooner appointment for HTN? Patient stated he will submit readings.

## 2018-05-18 ENCOUNTER — Encounter: Payer: Self-pay | Admitting: Optometry

## 2018-05-20 ENCOUNTER — Encounter: Payer: PRIVATE HEALTH INSURANCE | Admitting: Primary Care

## 2018-05-21 ENCOUNTER — Other Ambulatory Visit: Payer: Self-pay | Admitting: Primary Care

## 2018-05-21 MED ORDER — HYDROCHLOROTHIAZIDE 25 MG PO TABS *I*
25.0000 mg | ORAL_TABLET | Freq: Every morning | ORAL | 1 refills | Status: DC
Start: 2018-05-21 — End: 2018-11-15

## 2018-05-21 NOTE — Telephone Encounter (Signed)
LV  04/29/18    NV   09/07/18

## 2018-05-23 ENCOUNTER — Other Ambulatory Visit: Payer: Self-pay | Admitting: Primary Care

## 2018-05-24 ENCOUNTER — Encounter: Payer: Self-pay | Admitting: Primary Care

## 2018-06-18 ENCOUNTER — Other Ambulatory Visit: Payer: Self-pay | Admitting: Primary Care

## 2018-06-18 MED ORDER — METHYLPHENIDATE HCL 20 MG PO CP24 *I*
20.0000 mg | ORAL_CAPSULE | Freq: Every day | ORAL | 0 refills | Status: DC
Start: 2018-06-18 — End: 2018-07-16

## 2018-06-18 NOTE — Telephone Encounter (Signed)
I stop completed  Last appointment: 04/29/2018   Next appointment: 09/07/18

## 2018-07-16 ENCOUNTER — Other Ambulatory Visit: Payer: Self-pay | Admitting: Primary Care

## 2018-07-16 MED ORDER — METHYLPHENIDATE HCL 20 MG PO CP24 *I*
20.0000 mg | ORAL_CAPSULE | Freq: Every day | ORAL | 0 refills | Status: DC
Start: 2018-07-16 — End: 2018-08-18

## 2018-07-16 NOTE — Telephone Encounter (Signed)
Last seen 04-29-18, sched 09-07-18  Labs done 02-09-17, has future labs entered  I stop checked

## 2018-08-10 ENCOUNTER — Telehealth: Payer: Self-pay | Admitting: Primary Care

## 2018-08-10 DIAGNOSIS — K649 Unspecified hemorrhoids: Secondary | ICD-10-CM

## 2018-08-10 NOTE — Telephone Encounter (Signed)
Pt called he is having an issue with Hemorids, he is not having any luck with the over the counter remedies. He would like some advice.

## 2018-08-11 NOTE — H&P (Signed)
Chief Complaint   Patient presents with    New Patient Visit       Ronald Hughes comes to the colorectal clinic with complaints of 1 month of anorecatal pain. His symptoms include sharp pain with defecation, and pain that lasts 3-4 hours after defecation. He has bleeding with some but not all bowel movements.  He has tried sitz baths and preparation H with some relief. He has also used calmoseptine ointment. He has a bowel movement three times per day, and does occasionally need to strain to have a bowel movement. He sits for < 5 minutes on the toilet. The stool is formed. He does not regularly take stool softeners, laxatives, or fiber. Last colonoscopy was in 2017, were he had 4 polyps removed. He is due this year for a repeat colonoscopy.    ROS:   Otherwise well, other than the above stated in the HPI.    The patient's allergies, medications, past medical, social and family histories were reviewed and updated in the electronic medical record    Past Medical/Surgical History:    He  has a past medical history of ADD (attention deficit disorder), Anxiety, Depression, Dry eye syndrome, High blood pressure, Hypertension, Other cervical disc degeneration at C6-C7 level (02/09/2017), and Sleep apnea.    He  has a past surgical history that includes Cervical spine surgery (Left, 02/09/2017) and pr laminec/facetect/foramin,cervical 1 seg (Left, 02/09/2017).    Family/Social History:    No family history of colon or rectal cancer. No family history of crohn's disease or ulcerative colitis.  Lives at home with his wife.     Physical Exam:    Vitals:    08/12/18 1418   BP: 127/67   Pulse: 76   Temp: 36.7 C (98 F)   Weight: 108.9 kg (240 lb)       General: well developed, well nourished, no acute distress  Orientation: alert and oriented times three  Eyes: anicteric sclera  Neuro: non-focal  Cardiac: normal rate and rhythm  Respiratory: Lungs clear bilaterally   Gastrointestinal: Bowel sounds normoactive, abdomen soft, no  organomegaly, no tenderness on palpation  --DRE:The patient was placed in the left side lying position. External anus and surrounding tissues examined. No rashes, excoriations, condyloma, or external hemorrhoids noted. He does have an anal fissure in the posterior position that is painful with palpation. He noticed similar pain to what he feels after a BM.  Anal tone is fair. No masses or defects noted in the rectal vault.  ANOSCOPY: Deferred due to pain with DRE  Patient tolerated well, there was a chaperone present for the DRE and anoscopy examinations.      Assessment/Plan:  Encounter Diagnoses   Name Primary?    Rectal bleeding Yes    Anal fissure      It was my pleasure to meet [name] today. The patient has a posterior anal fissure which is likely causing his symptoms. Diltiazem and lidocaine ointment ordered to the [facility], apply TID. Optimizing stool consistency and preventing constipation, straining, and sitting for long periods of time should ease the symptoms. Add a psyllium based fiber supplement, increase high fiber foods (goal of 30-35 grams of fiber daily), increase water intake to at least 64 oz per day, avoid sitting for longer than 5 minutes on the toilet, avoid straining with bowel movements. Perform warm sitz or tub baths twice daily.  Consider using a Squatty Potty for ideal body positioning with bowel movements. Return to  the clinic in 6 weeks for a follow up, he will call to make appointment with Dr. Celine Ahr if his symptoms don't improve to discuss EUA/botox. He was also advised that he needs to schedule a colonoscopy and can do so through our office if he would like.     Written instructions were provided and reviewed with the patient. All questions and concerns from the patient were addressed. The patient agrees with the current plan. The patient was encouraged to call or MyChart the office with questions, concerns or problems.    Edward Jolly, NP

## 2018-08-11 NOTE — Telephone Encounter (Signed)
Sent a my chart message.

## 2018-08-11 NOTE — Telephone Encounter (Signed)
Asking if it is time to get a referral for the hemorrhoids issues?    Please advise.

## 2018-08-11 NOTE — Telephone Encounter (Signed)
I can do that for him, referral placed to colorectal surgery.

## 2018-08-12 ENCOUNTER — Other Ambulatory Visit: Payer: Self-pay | Admitting: Surgery

## 2018-08-12 ENCOUNTER — Ambulatory Visit: Payer: PRIVATE HEALTH INSURANCE | Admitting: Surgery

## 2018-08-12 ENCOUNTER — Encounter: Payer: Self-pay | Admitting: Surgery

## 2018-08-12 VITALS — BP 127/67 | HR 76 | Temp 98.0°F | Wt 240.0 lb

## 2018-08-12 DIAGNOSIS — K602 Anal fissure, unspecified: Secondary | ICD-10-CM

## 2018-08-12 DIAGNOSIS — K625 Hemorrhage of anus and rectum: Secondary | ICD-10-CM

## 2018-08-12 MED ORDER — WHITE PETROLATUM EX OINT
Freq: Three times a day (TID) | 1 refills | Status: DC
Start: 2018-08-12 — End: 2019-06-16

## 2018-08-12 NOTE — Patient Instructions (Signed)
ANAL FISSURE    An anal fissure is a small rip or tear in the lining of the analcanal. Fissures are common, but are often confused with other anal conditions, such as hemorrhoids.      Symptoms Include    - A sharp pain that begins with bowel movement and may last for minutes to hours  - Bright red blood on the stool, the toilet paper, or in the toilet bowl  - A small lump or skin tag on the skin near the anus    What causes anal fissures?    Most fissures are caused by trauma to the inner lining of the anus. This can happen with hard, dry stools or with loose, frequent stools.     How are anal fissures treated?    Treatment is aimed to help the anal sphincter muscle to relax, relieve pain and spasms, and allow healing.      ? Fiber supplements (30-35 grams daily) will make stools soft, formed and bulky.  o Please see our fiber supplement handout.  ? Drink 8-10 glasses of water daily to prevent hard stools.  ? Soak in warm water for 10-15 minutes a few times a day to soothe the fissure.  ? Over the counter stool softeners will make stools easier to pass  ? Topical prescription ointments (nifedipine or nitroglycerin) applied to the perianal area will help with anal relaxation and start the healing process.    ? Topical lidocaine ointment applied to the perianal area may help with pain.  ? AVOID narcotic pain medications - they are constipating and will worsen pain.    Most anal fissures will heal without surgery.  When surgery is necessary, the goal is to help the anal sphincter relax to allow the anal lining to heal.       Botox injection into the anal fissure is successful in 50-80% of patients.   Sphincterotomy involves dividing the inner part of the anal sphincter and is successful in more than 90% of patients.  Rarely this procedure may affect bowel control.      Fissures can recur after treatment.  It is important to maintain a good bowel regimen and eat a high fiber diet.  If an anal  fissure does not heal with treatment, it is important to be evaluated for other possible conditions.                                         FIBER SUPPLEMENTATION    Fiber is a dietary substance that the body cant digest.  It is found in fruits, nuts, vegetables and grains.  There are two varieties of fiber, and many foods contain both types. SOLUBLE fiber soaks up water to form a gel, and helps regulate blood sugar and lower cholesterol.  It can also make you feel more full.  Examples of SOLUBLE fiber include oats, fruits, barley and beans.  INSOLUBLE fiber passes through the digestive system intact and can help prevent constipation, hemorrhoids, and diverticulosis by making stools more regular and easy to pass.  INSOLUBLE fiber is found in wheat, whole grains and vegetables.     How much daily fiber do I need?    You need 25-35 grams of fiber daily. It is important to drink 8-10 glasses of water daily in order to successfully move the fiber through the digestive system.   Remember to  leave the skins on fruits and vegetables; this is where the fiber is.      4-5 Grams of fiber  One apple. One slice of whole wheat bread.  One cup of broccoli.  One cup of cooked oatmeal. One sweet potato. One pear. Two zucchinis. One large banana.     6-8 Grams of fiber  One cup of cooked barley.  One cup of squash.  One avocado.  One cup of raspberries.  One cup of collard greens.  One cup of lentils.  One cup of green peas.     10-20 Grams of fiber  One cup of black beans.  One cup of prunes.  One cup of almonds.  One cup of quinoa.  One cup of lima beans.  Five figs.      What are the side effects of fiber?    When you start to add fiber to your diet you may notice gas or bloating for 1-2 weeks. This is because some of the bacteria in your colon can digest (ferment) fiber, even though your body cant.  Adding fiber slowly can minimize symptoms of gas and bloating.  Stick with it!    What are fiber supplements?    Fiber  supplements deliver mainly soluble fiber in the form of a tablet, powder (recommended), or chewable gummy. Some are extracted from plants and some are manufactured.  Fiber supplements cannot replace a healthy diet but do help ensure adequate fiber intake.     Here are a few examples.  We do not recommend a particular brand name; you can use generic.       Psyllium husk (Metamucil, Konsyl) - made from the outer coating of the plantago plant   Acacia fiber (Clear Fiber, Skinny Gut) - made from the sap of the acacia tree   Wheat dextrin (Benefiber?) - extracted from the starch in whole grain wheat   Methylcellulose (Citrucel?) - a semi-natural sugar found in plant cells    Inulin - a soluble fiber extracted from the chicory root that does tend to cause gas   Calcium polycarbophil (FiberCon?)- a synthetic fiber         Step 1:  position on the toilet  Sit with your knees above your hips.    Rise up on your toes.  Support your feet on a footstool.  Lean forward at your hips.  Keep your back straight and you pelvis tilted forward.  Put your hands or elbows on your knees.        Open your mouth a little and relax your tongue.    Step 2: Preparing to bear down  Relax your rectum and feel it bulge outward.  Inhale. Allow your belly push out like a water balloon (belly breath)  Tighten your upper abdomen.  Breathe gently through an open mouth.    Step 3: Bearing down  Grunt or groan.  Do not hold your breath.  Bear down for 15 seconds while continuing to breathe.  Relax for 30 seconds.  Keep your correct posture.   Repeat this process 4 or 5 times.  In nothing happens, get up and go on your way.    Defecating Tips and Tricks   A routine may be helpful.  Try sitting on the toilet after breakfast.     Attempt to defecate when you feel the urge.  Take your time.   Avoid straining.  If you are holding your breath, clenching your jaw and face, and bearing  down hard, you are straining.     Remember--it is okay if you do  not defecate every time.          1) Add fiber powder daily. Mix in water and drink quickly. Start with 1 teaspoon and then increase over a week to 1 tablespoon. Take daily. It will take 1-2 weeks for you to notice a difference in your BMs- stick with it.  2) Goal for liquid intake is 64 oz a day. Work up towards this goal. Alcohol and caffeine don't count.  3) Sitz baths 1-2 x per day helps the muscles relax and heal the area. Do this for at least 2 weeks.  4) Apply the ointment to the site 2 times per day.   5) Come back in 6 weeks if you're not better to talk about botox

## 2018-08-18 ENCOUNTER — Other Ambulatory Visit: Payer: Self-pay | Admitting: Primary Care

## 2018-08-18 NOTE — Telephone Encounter (Signed)
Last visit 04/29/18  Next visit 01/26/19  Labs 2018

## 2018-08-19 MED ORDER — METHYLPHENIDATE HCL 20 MG PO CP24 *I*
20.0000 mg | ORAL_CAPSULE | Freq: Every day | ORAL | 0 refills | Status: DC
Start: 2018-08-19 — End: 2018-09-19

## 2018-08-30 ENCOUNTER — Ambulatory Visit: Payer: PRIVATE HEALTH INSURANCE | Admitting: Primary Care

## 2018-09-07 ENCOUNTER — Encounter: Payer: PRIVATE HEALTH INSURANCE | Admitting: Primary Care

## 2018-09-19 ENCOUNTER — Other Ambulatory Visit: Payer: Self-pay | Admitting: Primary Care

## 2018-09-20 MED ORDER — METHYLPHENIDATE HCL 20 MG PO CP24 *I*
20.0000 mg | ORAL_CAPSULE | Freq: Every day | ORAL | 0 refills | Status: DC
Start: 2018-09-20 — End: 2018-10-15

## 2018-09-20 NOTE — Telephone Encounter (Signed)
Last seen 04-29-18, sched 01-26-19  Labs 01-23-17, future labs entered  I stop checked

## 2018-10-15 ENCOUNTER — Other Ambulatory Visit: Payer: Self-pay | Admitting: Primary Care

## 2018-10-15 ENCOUNTER — Encounter: Payer: Self-pay | Admitting: Optometry

## 2018-10-15 MED ORDER — METHYLPHENIDATE HCL 20 MG PO CP24 *I*
20.0000 mg | ORAL_CAPSULE | Freq: Every day | ORAL | 0 refills | Status: DC
Start: 2018-10-15 — End: 2018-11-23

## 2018-10-15 NOTE — Telephone Encounter (Signed)
Last seen 04-29-18,  sched 01-26-19  Labs 01-23-17,has future labs  I stop checked

## 2018-11-03 ENCOUNTER — Other Ambulatory Visit: Payer: Self-pay | Admitting: Primary Care

## 2018-11-03 DIAGNOSIS — H52223 Regular astigmatism, bilateral: Secondary | ICD-10-CM

## 2018-11-03 NOTE — Telephone Encounter (Signed)
Last seen 04-29-18, nothing sched  Labs 01-23-17, has future labs entered

## 2018-11-04 ENCOUNTER — Telehealth: Payer: Self-pay | Admitting: Primary Care

## 2018-11-04 NOTE — Telephone Encounter (Signed)
Patient would like to return to work on Monday the 24th to work after going to [place] and returning to [place] on the 10th.  He has quareanteed from 8/10 at 8/24, his employer the [employer]

## 2018-11-05 ENCOUNTER — Encounter: Payer: Self-pay | Admitting: Primary Care

## 2018-11-12 NOTE — Telephone Encounter (Signed)
Letter sent via my chart

## 2018-11-13 ENCOUNTER — Other Ambulatory Visit: Payer: Self-pay | Admitting: Primary Care

## 2018-11-15 NOTE — Telephone Encounter (Signed)
LOV  04-29-2018  NOV  01-26-2019  LABS 01-23-2017

## 2018-11-16 ENCOUNTER — Other Ambulatory Visit: Payer: Self-pay | Admitting: Primary Care

## 2018-11-20 ENCOUNTER — Encounter: Payer: Self-pay | Admitting: Primary Care

## 2018-11-23 ENCOUNTER — Other Ambulatory Visit: Payer: Self-pay | Admitting: Primary Care

## 2018-11-23 MED ORDER — METHYLPHENIDATE HCL 20 MG PO CP24 *I*
20.0000 mg | ORAL_CAPSULE | Freq: Every day | ORAL | 0 refills | Status: DC
Start: 2018-11-23 — End: 2018-12-21

## 2018-11-23 NOTE — Telephone Encounter (Signed)
Last seen 04-29-18, Ronald Hughes 01-26-19  Labs 01-23-17, Has future labs  I stop checked

## 2018-12-03 ENCOUNTER — Encounter: Payer: Self-pay | Admitting: Primary Care

## 2018-12-06 ENCOUNTER — Telehealth: Payer: Self-pay

## 2018-12-06 NOTE — Telephone Encounter (Unsigned)
Copied from [place] [phone]. Topic: Appointments - Schedule Appointment  >> Dec 06, 2018  2:45 PM Huel Cote wrote:  Patient called in and scheduled:      Are you on any blood thinners? If yes, who prescribes them and what is their phone number? no  Are you diabetic? no  Do you have Kidney Failure or are you on Kidney Dialysis? no  Have you ever been diagnosed with cirrhosis of the liver or had banding for esophageal varices? no  Do you have an automated defibrillator? no  Do you have an LVAD? no  Do you have a tracheostomy? no  Do you use BIPAP or oxygen at home? no  Is the patients weight greater than 350lbs? no      Procedure Type: COB  Procedure Location: [address]  Date: [id number]  Arrival time: 7:15 am  Dr. Talitha Givens    COVID Testing Dates: 06/29/2019 or 06/30/2019  COVID Testing Site Name: [id number]       Patient verbalized understanding that [employer] at a [facility] Pre[facility] Testing Site is required or procedure will be postponed.   Patient would like prep instructions sent via (mail, MyChart, or email): Home address on file

## 2018-12-21 ENCOUNTER — Other Ambulatory Visit: Payer: Self-pay | Admitting: Primary Care

## 2018-12-21 MED ORDER — METHYLPHENIDATE HCL 20 MG PO CP24 *I*
20.0000 mg | ORAL_CAPSULE | Freq: Every day | ORAL | 0 refills | Status: DC
Start: 2018-12-21 — End: 2019-01-21

## 2018-12-21 NOTE — Telephone Encounter (Signed)
Last seen 04-29-18, sched 01-26-19  Labs 01-19-17, has future labs entered  I sop checked

## 2019-01-14 ENCOUNTER — Other Ambulatory Visit: Payer: Self-pay | Admitting: Primary Care

## 2019-01-14 MED ORDER — FENOFIBRATE 48 MG PO TABS *I*
48.0000 mg | ORAL_TABLET | Freq: Every day | ORAL | 5 refills | Status: DC
Start: 2019-01-14 — End: 2020-01-27

## 2019-01-14 NOTE — Telephone Encounter (Signed)
Last seen 04-29-18, sched 06-16-19  Labs 02-09-17, has future labs ntered

## 2019-01-21 ENCOUNTER — Other Ambulatory Visit: Payer: Self-pay | Admitting: Primary Care

## 2019-01-21 NOTE — Telephone Encounter (Signed)
Last appointment: 04/29/2018   Next appointment: 06/16/2018  I stop complpleted

## 2019-01-24 MED ORDER — METHYLPHENIDATE HCL 20 MG PO CP24 *I*
20.0000 mg | ORAL_CAPSULE | Freq: Every day | ORAL | 0 refills | Status: DC
Start: 2019-01-24 — End: 2019-02-17

## 2019-01-26 ENCOUNTER — Telehealth: Payer: Self-pay | Admitting: Optometry

## 2019-01-26 ENCOUNTER — Encounter: Payer: PRIVATE HEALTH INSURANCE | Admitting: Primary Care

## 2019-01-26 NOTE — Telephone Encounter (Signed)
Please fax eyeglass prescription to [phone].

## 2019-01-26 NOTE — Telephone Encounter (Signed)
Ronald Hughes from [facility] called again requesting the MRX be faxed to [phone] as the patient is already there and has frames picked out, just waiting on the Fax.

## 2019-01-26 NOTE — Telephone Encounter (Signed)
DONE

## 2019-02-17 ENCOUNTER — Other Ambulatory Visit: Payer: Self-pay | Admitting: Primary Care

## 2019-02-17 MED ORDER — LOSARTAN POTASSIUM 50 MG PO TABS *I*
50.0000 mg | ORAL_TABLET | Freq: Every day | ORAL | 3 refills | Status: DC
Start: 2019-02-17 — End: 2019-11-04

## 2019-02-17 MED ORDER — METHYLPHENIDATE HCL 20 MG PO CP24 *I*
20.0000 mg | ORAL_CAPSULE | Freq: Every day | ORAL | 0 refills | Status: DC
Start: 2019-02-17 — End: 2019-03-25

## 2019-02-17 NOTE — Telephone Encounter (Signed)
Last seen 04-29-18, sched 06-16-19  Labs 02-09-17, has future labs entered from Feb  I stop checked

## 2019-03-25 ENCOUNTER — Other Ambulatory Visit: Payer: Self-pay | Admitting: Primary Care

## 2019-03-25 MED ORDER — METHYLPHENIDATE HCL 20 MG PO CP24 *I*
20.0000 mg | ORAL_CAPSULE | Freq: Every day | ORAL | 0 refills | Status: DC
Start: 2019-03-25 — End: 2019-04-26

## 2019-03-25 NOTE — Telephone Encounter (Signed)
Last seen 04-29-18, sched 06-16-19  Labs 01-23-17, had future labs not done from 2/20, I stop checked

## 2019-04-26 ENCOUNTER — Other Ambulatory Visit: Payer: Self-pay | Admitting: Primary Care

## 2019-04-26 NOTE — Telephone Encounter (Signed)
Last seen 04-29-18, sched 06-16-19  Labs 02-09-17, has future labs entered  I stop checked

## 2019-04-27 ENCOUNTER — Other Ambulatory Visit: Payer: Self-pay | Admitting: Primary Care

## 2019-04-27 MED ORDER — METHYLPHENIDATE HCL 20 MG PO CP24 *I*
20.0000 mg | ORAL_CAPSULE | Freq: Every day | ORAL | 0 refills | Status: DC
Start: 2019-04-27 — End: 2019-06-01

## 2019-04-27 NOTE — Telephone Encounter (Signed)
Last seen 04-29-18, Sched 06-16-18  Labs 01-23-17, has future labs   I stop checked

## 2019-05-18 ENCOUNTER — Other Ambulatory Visit: Payer: Self-pay | Admitting: Primary Care

## 2019-05-18 NOTE — Telephone Encounter (Signed)
LOV  2.13.20  NOV  4.1.21  LABS  11.9.18

## 2019-05-26 ENCOUNTER — Telehealth: Payer: Self-pay | Admitting: Gastroenterology

## 2019-05-26 NOTE — Telephone Encounter (Signed)
Copied from [place]. Topic: Return Call - Speak to Provider/Office Staff  >> May 26, 2019  9:45 AM Maryann Alar wrote:  Carmela calling to verify date of patient's colonoscopy as she could not see it on my chart and could not remember date. Writer does not see any appointments schedule. Writer does see message that was routed to gi navigator   Procedure Type: COB  Procedure Location: [address]  Date: [id number]  Arrival time: 7:15 am  Dr. Talitha Givens    COVID Testing Dates: 06/29/2019 or 06/30/2019  COVID Testing Site Name: [address]       Please advise.  Patient may be reached at [phone]

## 2019-05-31 NOTE — Telephone Encounter (Signed)
Called patient to discuss appointment scheduling. Advised that  he was not added to the schedule. Patient is now scheduled for 07/14/2019

## 2019-05-31 NOTE — Telephone Encounter (Signed)
Procedure Type: COD  Sedation Type: MODSED  Procedure Location: [facility]  Procedure Date: 07/14/2019  Arrival time: 1:15 pm  Dr.: Decross    Anticoag: no  Coag ordering provider: -  Phone number of anticoag ordering provider: -      COVID Testing Dates: 07/10/2019  COVID Testing Site:      [facility]  [address], [place] [address]   Monday - Friday 7:30 a.m. - 5 p.m.   Saturday - Sunday 8 a.m. - 12 p.m.   Jan. 18 8 a.m. - 12 p.m.   Phone: [phone]          Did patient answer yes to any prescreen questions? no    Patient verbalized understanding that [employer] at a [facility] Pre-Pro[facility] Testing Site is required or procedure will be postponed.   Preparation instructions have been sent via: mychart    .

## 2019-06-01 ENCOUNTER — Other Ambulatory Visit: Payer: Self-pay | Admitting: Primary Care

## 2019-06-02 MED ORDER — METHYLPHENIDATE HCL 20 MG PO CP24 *I*
20.0000 mg | ORAL_CAPSULE | Freq: Every day | ORAL | 0 refills | Status: DC
Start: 2019-06-02 — End: 2019-06-30

## 2019-06-02 NOTE — Telephone Encounter (Signed)
Last seen 04-29-18, sched 06-16-19  Labs 01-23-17, has future labs   I stop checked

## 2019-06-08 ENCOUNTER — Other Ambulatory Visit
Admission: RE | Admit: 2019-06-08 | Discharge: 2019-06-08 | Disposition: A | Discharge status: Home / Self Care | Payer: PRIVATE HEALTH INSURANCE | Source: Ambulatory Visit | Attending: Primary Care | Admitting: Primary Care

## 2019-06-08 ENCOUNTER — Other Ambulatory Visit: Payer: Self-pay | Admitting: Primary Care

## 2019-06-08 DIAGNOSIS — E7800 Pure hypercholesterolemia, unspecified: Secondary | ICD-10-CM

## 2019-06-08 LAB — LIPID PANEL
Chol/HDL Ratio: 4.6
Cholesterol: 212 mg/dL — AB
HDL: 46 mg/dL (ref 40–60)
LDL Calculated: 124 mg/dL
Non HDL Cholesterol: 166 mg/dL
Triglycerides: 209 mg/dL — AB

## 2019-06-08 LAB — BASIC METABOLIC PANEL
Anion Gap: 11 (ref 7–16)
CO2: 25 mmol/L (ref 20–28)
Calcium: 9.7 mg/dL (ref 8.6–10.2)
Chloride: 105 mmol/L (ref 96–108)
Creatinine: 0.95 mg/dL (ref 0.67–1.17)
GFR,Black: 104 *
GFR,Caucasian: 90 *
Glucose: 93 mg/dL (ref 60–99)
Lab: 20 mg/dL (ref 6–20)
Potassium: 4.7 mmol/L (ref 3.3–5.1)
Sodium: 141 mmol/L (ref 133–145)

## 2019-06-08 LAB — CBC AND DIFFERENTIAL
Baso # K/uL: 0 10*3/uL (ref 0.0–0.1)
Basophil %: 0.5 %
Eos # K/uL: 0.1 10*3/uL (ref 0.0–0.5)
Eosinophil %: 1.7 %
Hematocrit: 46 % (ref 40–51)
Hemoglobin: 14.8 g/dL (ref 13.7–17.5)
IMM Granulocytes #: 0 10*3/uL (ref 0.0–0.0)
IMM Granulocytes: 0.5 %
Lymph # K/uL: 2.6 10*3/uL (ref 1.3–3.6)
Lymphocyte %: 32.7 %
MCH: 29 pg (ref 26–32)
MCHC: 32 g/dL (ref 32–37)
MCV: 89 fL (ref 79–92)
Mono # K/uL: 0.7 10*3/uL (ref 0.3–0.8)
Monocyte %: 8.6 %
Neut # K/uL: 4.5 10*3/uL (ref 1.8–5.4)
Nucl RBC # K/uL: 0 10*3/uL (ref 0.0–0.0)
Nucl RBC %: 0 /100 WBC (ref 0.0–0.2)
Platelets: 242 10*3/uL (ref 150–330)
RBC: 5.1 MIL/uL (ref 4.6–6.1)
RDW: 12.5 % (ref 11.6–14.4)
Seg Neut %: 56 %
WBC: 8 10*3/uL (ref 4.2–9.1)

## 2019-06-08 LAB — PSA (EFF.4-2010): PSA (eff. 4-2010): 0.31 ng/mL (ref 0.00–4.00)

## 2019-06-14 ENCOUNTER — Telehealth: Payer: Self-pay | Admitting: Primary Care

## 2019-06-14 NOTE — Telephone Encounter (Signed)
Left message requesting pt. To call office.

## 2019-06-14 NOTE — Telephone Encounter (Signed)
-----   Message from Bea Graff, MD sent at 06/14/2019  5:06 PM EDT -----  Labs not too bad. Sugar normal, TG better than last check, mildly elevated at 209, but no where near where they were 5 years ago (704). Id suggest no changes to med dosing. PSA was fine.

## 2019-06-16 ENCOUNTER — Ambulatory Visit: Payer: PRIVATE HEALTH INSURANCE | Admitting: Primary Care

## 2019-06-16 ENCOUNTER — Encounter: Payer: Self-pay | Admitting: Primary Care

## 2019-06-16 VITALS — BP 122/78 | HR 58 | Temp 98.1°F | Ht 67.0 in | Wt 250.0 lb

## 2019-06-16 DIAGNOSIS — I10 Essential (primary) hypertension: Secondary | ICD-10-CM

## 2019-06-16 DIAGNOSIS — Z0000 Encounter for general adult medical examination without abnormal findings: Secondary | ICD-10-CM

## 2019-06-16 DIAGNOSIS — E781 Pure hyperglyceridemia: Secondary | ICD-10-CM

## 2019-06-16 NOTE — Telephone Encounter (Signed)
Pt. Made aware     Labs not too bad. Sugar normal, TG better than last check, mildly elevated at 209, but no where near where they were 5 years ago (704). Id suggest no changes to med dosing. PSA was fine.     He verbalizes understanding.

## 2019-06-16 NOTE — Progress Notes (Signed)
Patient presents for complete physical examination  1.  Hypertension-on losartan and hydrochlorothiazide.  He tolerates well without lightheadedness or dizziness.  No polyuria polydipsia.  Blood pressure today looks good.  He is not to the gym as often as he used to hopes to increase that frequency  2.  Hyperlipidemia-actually officially hypertriglyceridemia he had a value up above 700 now on fib rates he is at the low 200s.  LDL is borderline elevated but risk calculates to 6.8%.  We discussed at this point would not warrant dual therapy.  He is open to work on weight reduction.  Few years back is able to get down to 218 but that slipped gradually over the years.  3.  ADD-he is on the stimulant.  He finds it offers good control.  He does not feel anxious on it.  Sometimes he wonders if he has optimal focus but if not interfering with function and not to a point we decided we should alter current approach.  4.  Anxiety/depression-on escitalopram.  He had a fairly stressful job 6 or 7 years ago that caused some problems.  He made the job change to his current position.  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  That job is going well.  Stress levels seem good.  However he is not noticing any adverse effect from the medication and thought he would be more comfortable just leaving it alone for now.  5.  Sleep apnea-uses a CPAP.  He finds it works well.  He is having some parts that may need changing.  There are some just average wear and tear.  Definitely feels he benefits from it it sounds like he is very compliant with its use.    Review of Systems   Constitutional: Negative.    HENT: Negative.    Eyes: Negative.    Respiratory: Negative.    Cardiovascular: Negative.    Gastrointestinal: Negative for constipation and diarrhea.        Had a problem with a fissure in the past year.  That healed with a topical therapy and increase fiber use.   Genitourinary: Negative.     Musculoskeletal: Negative.    Skin: Negative.    Neurological: Negative.    Psychiatric/Behavioral: Negative.        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  Has about a year or more for work himself then might consider a job change to retirement.  He and his wife might consider moving to [place] at that time.    MEDICAL PROBLEMS:  Patient Active Problem List   Diagnosis Code    Anxiety F41.9    Depression F32.9    ADD (attention deficit disorder) F98.8    Unspecified essential hypertension I10    Obesity E66.9    OSA (obstructive sleep apnea) G47.33    Erectile dysfunction N52.9    Other cervical disc degeneration at C6-C7 level M50.323        CURRENT MEDICATIONS:  Current Outpatient Medications   Medication Sig    methylphenidate (RITALIN LA) 20 MG 24 hr capsule Take 1 capsule (20 mg total) by mouth daily (before breakfast) Max daily dose: 20 mg    hydroCHLOROthiazide (HYDRODIURIL) 25 MG tablet TAKE 1 TABLET BY MOUTH EVERY DAY IN THE MORNING    losartan (COZAAR) 50 MG tablet Take 1 tablet (50 mg total) by mouth daily    fenofibrate (TRICOR)  48 MG tablet Take 1 tablet (48 mg total) by mouth daily    escitalopram (LEXAPRO) 20 MG tablet TAKE 1 TABLET BY MOUTH EVERY DAY    auto-titrating CPAP (AUTOSET) machine S10 AutoCPAP with min pres of 5 cm-max pres of 18 cm. Dispense with humidifier,heated tubing, & all required equipment. Duration: lifetime     No current facility-administered medications for this visit.        The patient's medications and allergies were reviewed, reconciled, updated as needed and changes were made.    ALLERGIES:  Allergies: No Known Allergies (drug, envir, food or latex)     EXAMINATION:  Vitals:    06/16/19 1009   BP: 122/78   BP Location: Right arm   Patient Position: Sitting   Cuff Size: large adult   Pulse: 58   Temp: 36.7 C (98.1 F)   TempSrc: Temporal   SpO2: 98%   Weight: 113.4 kg (250 lb)   Height: 1.702 m (5\' 7" )      Body  mass index is 39.16 kg/m.    Physical Exam  Constitutional:       Appearance: He is well-developed.   HENT:      Head: Normocephalic and atraumatic.      Right Ear: External ear normal.      Left Ear: External ear normal.      Nose: Nose normal.   Eyes:      General:         Right eye: No discharge.         Left eye: No discharge.      Conjunctiva/sclera: Conjunctivae normal.      Pupils: Pupils are equal, round, and reactive to light.   Neck:      Thyroid: No thyromegaly.      Trachea: No tracheal deviation.   Cardiovascular:      Rate and Rhythm: Normal rate and regular rhythm.      Heart sounds: Normal heart sounds. No murmur. No gallop.    Pulmonary:      Effort: Pulmonary effort is normal. No respiratory distress.      Breath sounds: Normal breath sounds. No wheezing or rales.   Chest:      Chest wall: No tenderness.   Abdominal:      General: Bowel sounds are normal. There is no distension.      Palpations: Abdomen is soft. There is no mass.      Tenderness: There is no abdominal tenderness. There is no guarding or rebound.      Hernia: There is no hernia in the left inguinal area.   Genitourinary:     Penis: Normal.       Testes: Normal.         Right: Mass not present.         Left: Mass not present.   Musculoskeletal:         General: No tenderness. Normal range of motion.      Cervical back: Normal range of motion and neck supple.      Comments: Well-healed scar from a cervical laminectomy.   Lymphadenopathy:      Head:      Right side of head: No submental or submandibular adenopathy.      Left side of head: No submental or submandibular adenopathy.      Cervical: No cervical adenopathy.   Skin:     General: Skin is warm and dry.      Coloration: Skin is  not pale.      Findings: No erythema or rash.   Neurological:      Mental Status: He is alert.      Cranial Nerves: No cranial nerve deficit.      Coordination: Coordination normal.      Deep Tendon Reflexes: Reflexes are normal and symmetric.    Psychiatric:         Behavior: Behavior normal.         Thought Content: Thought content normal.       EKG shows a trigeminal rhythm otherwise normal intervals normal axis no Q waves or ST changes.      ASSESSMENT/PLAN:  1.  Hypertension-blood pressure goal no change to ARB or diuretic dosing.  2.  Sleep apnea-benefiting from treatment.  If he needs a prescription for supplies he will let us know.  He will reach out to communicate with the company that supplies his device.  3.  Hypertriglyceridemia-he like to set himself a goal to see if he can get his weight down to bring this down further.  Also would like to try to get his LDL down lower.  Place an order to recheck lipids at the end of August.  4.  ADD-benefiting from medication.  Controlled substance agreement signed for him.  5.  Anxiety-as above we decided to continue escitalopram at current dose.  If in the future he wants to try to minimize his medication list probably would be 1 I would look to see if he still needed and would taper to a half dose for a month.  He will let us know when he is ready for that.    Reviewed safety issues, re seatbelts, helmets, trauma prevention.  Alcohol use reviewed, recommendations discusssed  Aware of STD risks-he feels he is low risk and declined screening.  Colonoscopy recommendations for colon cancer screening reviewed.  He has his repeat colonoscopy scheduled.  Reviewed skin cancer risk/prevention.  Reviewed self testicular examination.  Reviewed current issues and debate with prostate cancer screening.  His PSA was below 1 reassuring.  Diet and exercise recommendations reviewed.  Immunizations-he is received the Covid shot recommended the Shingrix shot.  He is deferring on that for now as he has some painting project he is coming completed home but he can set himself up on the nurses schedule or complete from the pharmacy.

## 2019-06-30 ENCOUNTER — Other Ambulatory Visit: Payer: Self-pay | Admitting: Primary Care

## 2019-06-30 ENCOUNTER — Telehealth: Payer: Self-pay

## 2019-06-30 NOTE — Telephone Encounter (Signed)
COVID Test Date: 07/09/19  COVID Test Location:  [facility]  Procedure location:[facility]  Arrival time: 1:15 PM  Does patient have correct prep instructions: NO   Questions about Anticoags (or N/A):  N/A    Resent MIRAPREP Instructions to [facility]

## 2019-07-01 ENCOUNTER — Other Ambulatory Visit: Payer: Self-pay

## 2019-07-01 DIAGNOSIS — Z01818 Encounter for other preprocedural examination: Secondary | ICD-10-CM

## 2019-07-01 MED ORDER — METHYLPHENIDATE HCL 20 MG PO CP24 *I*
20.0000 mg | ORAL_CAPSULE | Freq: Every day | ORAL | 0 refills | Status: DC
Start: 2019-07-01 — End: 2019-07-28

## 2019-07-01 NOTE — Telephone Encounter (Signed)
Last seen 06-16-19, nothing sched    Labs 06-08-19, has a future lab entered  I stop checked

## 2019-07-07 ENCOUNTER — Telehealth: Payer: Self-pay | Admitting: Primary Care

## 2019-07-07 ENCOUNTER — Encounter: Payer: Self-pay | Admitting: Primary Care

## 2019-07-07 MED ORDER — TADALAFIL 10 MG PO TABS *I*
10.0000 mg | ORAL_TABLET | Freq: Every day | ORAL | 5 refills | Status: DC | PRN
Start: 2019-07-07 — End: 2019-11-04

## 2019-07-07 NOTE — Telephone Encounter (Signed)
Hi Dr. Lorna Dibble,    At one point I had Cialis on my list of meds.  It is no longer there.  Can I get a refill?    DG

## 2019-07-07 NOTE — Telephone Encounter (Signed)
done

## 2019-07-10 ENCOUNTER — Ambulatory Visit: Payer: PRIVATE HEALTH INSURANCE | Attending: Gastroenterology

## 2019-07-10 DIAGNOSIS — Z01818 Encounter for other preprocedural examination: Secondary | ICD-10-CM

## 2019-07-10 DIAGNOSIS — Z20828 Contact with and (suspected) exposure to other viral communicable diseases: Secondary | ICD-10-CM | POA: Insufficient documentation

## 2019-07-10 DIAGNOSIS — Z20822 Contact with and (suspected) exposure to covid-19: Secondary | ICD-10-CM | POA: Insufficient documentation

## 2019-07-10 DIAGNOSIS — Z01812 Encounter for preprocedural laboratory examination: Secondary | ICD-10-CM | POA: Insufficient documentation

## 2019-07-10 LAB — COVID-19 NAAT (PCR): COVID-19 NAAT (PCR): NEGATIVE

## 2019-07-10 LAB — COVID-19 PCR

## 2019-07-14 ENCOUNTER — Ambulatory Visit
Admission: RE | Admit: 2019-07-14 | Discharge: 2019-07-14 | Disposition: A | Discharge status: Home / Self Care | Payer: PRIVATE HEALTH INSURANCE | Source: Ambulatory Visit | Attending: Gastroenterology | Admitting: Gastroenterology

## 2019-07-14 ENCOUNTER — Encounter: Payer: Self-pay | Admitting: Gastroenterology

## 2019-07-14 DIAGNOSIS — D120 Benign neoplasm of cecum: Secondary | ICD-10-CM | POA: Insufficient documentation

## 2019-07-14 DIAGNOSIS — D125 Benign neoplasm of sigmoid colon: Secondary | ICD-10-CM | POA: Insufficient documentation

## 2019-07-14 DIAGNOSIS — K621 Rectal polyp: Secondary | ICD-10-CM | POA: Insufficient documentation

## 2019-07-14 DIAGNOSIS — D128 Benign neoplasm of rectum: Secondary | ICD-10-CM | POA: Insufficient documentation

## 2019-07-14 DIAGNOSIS — D122 Benign neoplasm of ascending colon: Secondary | ICD-10-CM

## 2019-07-14 DIAGNOSIS — K5730 Diverticulosis of large intestine without perforation or abscess without bleeding: Secondary | ICD-10-CM | POA: Insufficient documentation

## 2019-07-14 DIAGNOSIS — Z1211 Encounter for screening for malignant neoplasm of colon: Secondary | ICD-10-CM

## 2019-07-14 DIAGNOSIS — Z86010 Personal history of colonic polyps: Secondary | ICD-10-CM | POA: Insufficient documentation

## 2019-07-14 HISTORY — DX: Polyp of colon: K63.5

## 2019-07-14 LAB — HM COLONOSCOPY

## 2019-07-14 MED ORDER — MIDAZOLAM HCL 5 MG/5ML IJ SOLN *I*
INTRAMUSCULAR | Status: AC
Start: 2019-07-14 — End: 2019-07-14
  Filled 2019-07-14: qty 10

## 2019-07-14 MED ORDER — MEPERIDINE HCL 50 MG/ML IJ SOLN *WRAPPED*
INTRAMUSCULAR | Status: AC
Start: 2019-07-14 — End: 2019-07-14
  Filled 2019-07-14: qty 2

## 2019-07-14 MED ORDER — MEPERIDINE HCL 50 MG/ML IJ SOLN *WRAPPED*
INTRAMUSCULAR | Status: AC | PRN
Start: 2019-07-14 — End: 2019-07-14
  Administered 2019-07-14: 25 mg via INTRAVENOUS
  Administered 2019-07-14: 50 mg via INTRAVENOUS

## 2019-07-14 MED ORDER — LACTATED RINGERS IV SOLN *I*
100.0000 mL/h | INTRAVENOUS | Status: DC
Start: 2019-07-14 — End: 2019-07-15
  Administered 2019-07-14: 100 mL/h via INTRAVENOUS

## 2019-07-14 MED ORDER — MIDAZOLAM HCL 1 MG/ML IJ SOLN *I* WRAPPED
INTRAMUSCULAR | Status: AC | PRN
Start: 2019-07-14 — End: 2019-07-14
  Administered 2019-07-14 (×3): 2 mg via INTRAVENOUS

## 2019-07-14 NOTE — Discharge Instructions (Signed)
Gastroenterology Unit  Discharge Instructions for Colonoscopy      07/14/2019    2:39 PM    Colonoscopy and Polyp(s) Removed    Do not drive, operate heavy machinery, drink alcoholic beverages, make important personal or business decisions, or sign legal documents until the next day.      Return to your usual diet   Return to taking your usual medications    Things you may expect:   A small amount of bright red blood in your stool   It may be a few days before you have a bowel movement   You may have cramping, bloating, and feelings of "gas". These feelings should go away as you pass gas. If you still feel uncomfortable, walking around will help to pass the gas.   You were given medication to help you relax during the test. You may feel "fuzzy" and drowsy. Go home and rest for at least 4-6 hours.    You should call your doctor for any of the following:   Bad stomach pain   Fever   Bright red bleeding or clots (This may happen up to 20 days after the test.)   Dizziness or weakness that gets worse or lasts up to 24 hours.   Pain or redness at the IV site    If you have a serious problem after hours, Call [phone] to reach the GI physician on call. If you are unable to reach your doctor, go to the [facility] Emergency Department.    Follow Up Care:   If biopsies were taken during your procedure, we will send you the pathology results within 7-10 days. If you do not receive your pathology results after 10 days please call [phone]   Repeat exam in  3 year(s)    New Prescriptions    No medications on file

## 2019-07-14 NOTE — Procedures (Addendum)
Colonoscopy Procedure Note   Date of Procedure: 07/14/2019   Referring Physician: No ref. provider found  Primary Physician: Bea Graff, MD        Attending Physician: Roanna Raider, MD  Fellow:   None  Indications: Surveillance for history of colonic polyps  Previous colonoscopy: Yes -- Date: 2017  Medications: Meperidine 75 mg IV and Midazolam 6 mg IV were administered incrementally over the course of the procedure to achieve an adequate level of conscious sedation.    Moderate Sedation Face Times  Start Time: [id number]  End Time: 1440  Duration (minutes): 29 Minutes        I provided moderate sedation.  During this time I was face-to-face with the patient and supervising the RN; who monitored the patient's level of consciousness and physiological status.    Scopes: [id number]    Procedure Details: Full disclosures of risks were reviewed with patient as detailed on the consent form. The patient was placed in the left lateral decubitus position and monitored with continuous pulse oximetry, interval blood pressure monitoring and direct observations.   After anorectal examination was performed, the colonoscope was inserted into the rectum and advanced under direct vision to the terminal ileum. The procedure was considered not difficult.  During withdrawal examination, the final quality of the prep was [facility] Bowel Prep Scale:  Right Colon: Grade3- (entire mucosa of colon segment seen well, with no residual staining, small fragments of stool, or opaque liquid)  Transverse Colon: Grade 3- (entire mucosa of colon segment seen well, with no residual staining, small fragments of stool, or opaque liquid)  Left Colon: Grade 3- (entire mucosa of colon segment seen well, with no residual staining, small fragments of stool, or opaque liquid)  A careful inspection was made as the colonoscope was withdrawn. A retroflexed view of the rectum was performed; findings and interventions are described below. The patient was recovered  in the GI recovery area.     Scope Times:     Insertion Time: [id number]  Cecum Time: [id number]  Exit Time: [id number]  Withdrawal time - 20 min    Findings: normal perineum and anus on DRE.  Scope passed to cecum and into TI x 5cm. Well prepped.  Full view of distended caput cecum including medial wall from valve to AO was obtained, and photodocumented.  A 61mm sessile cecal polyp was removed by exacto cold snare.  At 77mm SSP in the right colon was removed by exacto cold snare.  A 69mm sessile polyp at 40cm was removed by exacto cold snare.  A 56mm sessile polyp at 15cm was removed by exacto cold snare.  Forward and retroflex views of rectum were normal.  Minimal diverticulosis scattered in colon.      Intervention(s):      4 polyp(s) removed by cold snare biopsy      Complication(s):     none    EBL: 1 ml    Impression(s):     4 colon polyps removed    Recommendation(s):     Await pathology.   Repeat colonoscopy in three years.    Histopathologic Diagnosis:  FINAL DIAGNOSIS:   (A) Cecum, polyp, biopsy:   -Sessile serrated polyp/adenoma.     (B) Colon, right, polyp, biopsy:   -Sessile serrated polyp/adenoma.     (C) Colon, polyp at 40 cm, biopsy:   -Tubular adenoma.     (D) Colon, polyp at 15 cm, biopsy:   -Tubular adenoma.   -Hyperplastic polyp.  Emberley Kral, MD

## 2019-07-14 NOTE — Progress Notes (Signed)
Writer reviewed AVS in detail with Wife. Signature was not obtained due to current hospital recommendations regarding infection prevention practices related to the COVID-19 pandemic. Patients wife was given sealed copy of AVS. Wallis Bamberg, RN

## 2019-07-14 NOTE — Preop H&P (Signed)
OUTPATIENT PRE-PROCEDURE H&P    Chief Complaint / Indications for Procedure: history of polyps    For the extent of the COVID-19 pandemic, [facility] is avoiding physical signatures on consent forms in an effort to reduce transmission of COVID-19. I have reviewed the procedure, risks, and alternatives documented above with the patient. All questions have been answered. The patient expresses understanding and has verbally consented to the procedure and for blood transfusions in the event of an emergency during the procedure.      Past Medical History:     Past Medical History:   Diagnosis Date    ADD (attention deficit disorder)     Anxiety     Colon polyp     Depression     Dry eye syndrome     High blood pressure     Hypertension     Other cervical disc degeneration at C6-C7 level 02/09/2017    Sleep apnea      Past Surgical History:   Procedure Laterality Date    CERVICAL SPINE SURGERY Left 02/09/2017    C 6-7 foraminotomies    PR LAMINEC/FACETECT/FORAMIN,CERVICAL 1 SEG Left 02/09/2017    Procedure: C6-C7 FORAMINOTOMY, (POSTERIOR SITTING, C-Arm);  Surgeon: [name], MD;  Location: [facility] MAIN OR;  Service: Neurosurgery     Family History   Problem Relation Age of Onset    Cancer Mother         lung    Arthritis Father     Cancer Father         renal    Dementia Father 17    Cancer Maternal Grandmother         brain    Cancer Maternal Grandfather         lung    No Known Problems Brother     Arthritis Brother         Back, knee    No Known Problems Paternal Grandmother     Parkinsonism Paternal Grandfather     No Known Problems Maternal Aunt     No Known Problems Maternal Uncle     No Known Problems Paternal Aunt     No Known Problems Paternal Uncle     No Known Problems Daughter     No Known Problems Son     Cataracts Neg Hx     Diabetes Neg Hx     Glaucoma Neg Hx     Macular degeneration Neg Hx     Colon polyps Neg Hx     Colon cancer Neg Hx      Social History     Socioeconomic  History    Marital status: Married     Spouse name: Not on file    Number of children: Not on file    Years of education: Not on file    Highest education level: Not on file   Tobacco Use    Smoking status: Former Smoker     Types: Cigars     Quit date: 10/15/2015     Years since quitting: 3.7    Smokeless tobacco: Never Used    Tobacco comment: 1 cigars a day   Substance and Sexual Activity    Alcohol use: Yes     Comment: 2-3/ week    Drug use: No    Sexual activity: Not on file   Other Topics Concern    Not on file   Social History Narrative    Not on file  Allergies:  No Known Allergies (drug, envir, food or latex)    Medications:  (Not in a hospital admission)     Current Outpatient Medications   Medication    methylphenidate (RITALIN LA) 20 MG 24 hr capsule    hydroCHLOROthiazide (HYDRODIURIL) 25 MG tablet    losartan (COZAAR) 50 MG tablet    fenofibrate (TRICOR) 48 MG tablet    escitalopram (LEXAPRO) 20 MG tablet    tadalafil (CIALIS) 10 MG tablet    auto-titrating CPAP (AUTOSET) machine     Current Facility-Administered Medications   Medication Dose Route Frequency    Lactated Ringers Infusion  100 mL/hr Intravenous Continuous     Vitals:    07/14/19 1338   BP: 134/90   Pulse: 66   Resp: 16   Temp: 36.1 C (97 F)   Weight: 106.6 kg (235 lb)   Height: 172.7 cm (5\' 8" )       ROS:  [id number]    Physical Examination:  Head/Nose/Throat:negative  Lungs:Lungs clear  Cardiovascular:normal S1 and S2  Abdomen: abdomen soft, non-tender, nondistended, normal active bowel sounds, no masses or organomegaly      Lab Results: none    Radiology impressions (last 30 days):  No results found.    Currently Active Problems:  Patient Active Problem List   Diagnosis Code    Anxiety F41.9    Depression F32.9    ADD (attention deficit disorder) F98.8    Unspecified essential hypertension I10    Obesity E66.9    OSA (obstructive sleep apnea) G47.33    Erectile dysfunction N52.9    Other cervical  disc degeneration at C6-C7 level M50.323        Impression:  Surveillance colonoscopy    Plan:  Colonoscopy  Moderate Sedation    UPDATES TO PATIENT'S CONDITION on the DAY OF SURGERY/PROCEDURE    I. Updates to Patient's Condition (to be completed by a provider privileged to complete a H&P, following reassessment of the patient by the provider):    Full H&P done today; no updates needed.    II. Procedure Readiness   I have reviewed the patient's H&P and updated condition. By completing and signing this form, I attest that this patient is ready for surgery/procedure.      III. Attestation   I have reviewed the updated information regarding the patient's condition and it is appropriate to proceed with the planned surgery/procedure.    Roanna Raider, MD as of 2:05 PM 07/14/2019

## 2019-07-21 LAB — SURGICAL PATHOLOGY

## 2019-07-21 NOTE — Result Encounter Note (Signed)
Ronald Hughes please send letter:    Dear Mr Sarker:  The polyps removed during your colonoscopy were benign.  Based on your clinical information, and current national guidelines, I recommend that you repeat a colonoscopy once again in three (3) years.  Sincerely,  Dr. Talitha Givens

## 2019-07-28 ENCOUNTER — Other Ambulatory Visit: Payer: Self-pay | Admitting: Primary Care

## 2019-07-28 NOTE — Telephone Encounter (Signed)
I stop done    Last appointment: 06/16/2019  Next appointment: Visit date not found  Labs: 06/08/2019

## 2019-07-29 MED ORDER — METHYLPHENIDATE HCL 20 MG PO CP24 *I*
20.0000 mg | ORAL_CAPSULE | Freq: Every day | ORAL | 0 refills | Status: DC
Start: 2019-07-29 — End: 2019-09-01

## 2019-09-01 ENCOUNTER — Other Ambulatory Visit: Payer: Self-pay | Admitting: Primary Care

## 2019-09-01 MED ORDER — METHYLPHENIDATE HCL 20 MG PO CP24 *I*
20.0000 mg | ORAL_CAPSULE | Freq: Every day | ORAL | 0 refills | Status: DC
Start: 2019-09-01 — End: 2019-10-03

## 2019-09-01 NOTE — Telephone Encounter (Signed)
Last seen 06-16-19, nothing sched  Labs 06-16-19, I stop checked

## 2019-10-03 ENCOUNTER — Other Ambulatory Visit: Payer: Self-pay | Admitting: Primary Care

## 2019-10-03 MED ORDER — METHYLPHENIDATE HCL 20 MG PO CP24 *I*
20.0000 mg | ORAL_CAPSULE | Freq: Every day | ORAL | 0 refills | Status: DC
Start: 2019-10-03 — End: 2019-11-04

## 2019-10-03 NOTE — Telephone Encounter (Signed)
Last seen 06-16-19, nothing sched  Labs 06-08-19, I stop checked

## 2019-11-04 ENCOUNTER — Other Ambulatory Visit: Payer: Self-pay | Admitting: Primary Care

## 2019-11-04 MED ORDER — LOSARTAN POTASSIUM 50 MG PO TABS *I*
50.0000 mg | ORAL_TABLET | Freq: Every day | ORAL | 3 refills | Status: DC
Start: 2019-11-04 — End: 2019-12-13

## 2019-11-04 MED ORDER — TADALAFIL 10 MG PO TABS *I*
10.0000 mg | ORAL_TABLET | Freq: Every day | ORAL | 5 refills | Status: DC | PRN
Start: 2019-11-04 — End: 2020-03-26

## 2019-11-04 MED ORDER — METHYLPHENIDATE HCL 20 MG PO CP24 *I*
20.0000 mg | ORAL_CAPSULE | Freq: Every day | ORAL | 0 refills | Status: DC
Start: 2019-11-04 — End: 2019-12-03

## 2019-11-04 NOTE — Telephone Encounter (Signed)
I stop done    Last appointment: 06/16/2019  Next appointment: Visit date not found  Labs: 06/08/2019

## 2019-11-07 ENCOUNTER — Other Ambulatory Visit: Payer: Self-pay | Admitting: Primary Care

## 2019-11-07 NOTE — Telephone Encounter (Signed)
LOV  4.1.21  NOV  LABS  3.24.21

## 2019-11-12 ENCOUNTER — Other Ambulatory Visit: Payer: Self-pay | Admitting: Primary Care

## 2019-11-14 NOTE — Telephone Encounter (Signed)
LOV  4.1.21  NOV  LABS  3.24.21

## 2019-12-03 ENCOUNTER — Other Ambulatory Visit: Payer: Self-pay | Admitting: Primary Care

## 2019-12-05 MED ORDER — METHYLPHENIDATE HCL 20 MG PO CP24 *I*
20.0000 mg | ORAL_CAPSULE | Freq: Every day | ORAL | 0 refills | Status: DC
Start: 2019-12-05 — End: 2019-12-13

## 2019-12-05 NOTE — Telephone Encounter (Signed)
Last seen 06-16-19, nothing sched  Labs 06-08-19, I stop checked

## 2019-12-05 NOTE — Telephone Encounter (Signed)
Pt is out of medication

## 2019-12-12 ENCOUNTER — Encounter: Payer: Self-pay | Admitting: Primary Care

## 2019-12-12 ENCOUNTER — Telehealth: Payer: Self-pay | Admitting: Primary Care

## 2019-12-12 NOTE — Telephone Encounter (Signed)
He calling in with a blood pressure of 164/105 today he doesn't know what his pulse was.   There are no more appointments left for today.    Please advise.

## 2019-12-12 NOTE — Telephone Encounter (Signed)
Mychart appointment message to phone note:      12/13/2019 - 12/14/2019    Preferred Times: Any Time    Reason for visit: Request a New Problem Visit    Comments:  Blood pressure 164/105.  Toxic, super stressful work environment.  Not sleeping.  Super high anxiety.  Need to be seen asap.    Your schedule does not have a 30 minute slot, where is best to schedule him?  Please advise.

## 2019-12-12 NOTE — Telephone Encounter (Signed)
Sent mychart message, he is scheduled for tomorrow at 1:00 pm, December 13, 2019.

## 2019-12-12 NOTE — Telephone Encounter (Signed)
Give him the hospital follow up slot tomorrow.

## 2019-12-13 ENCOUNTER — Ambulatory Visit: Payer: PRIVATE HEALTH INSURANCE | Admitting: Primary Care

## 2019-12-13 ENCOUNTER — Encounter: Payer: Self-pay | Admitting: Primary Care

## 2019-12-13 VITALS — BP 130/94 | HR 79 | Temp 97.3°F | Ht 68.0 in | Wt 249.8 lb

## 2019-12-13 DIAGNOSIS — I10 Essential (primary) hypertension: Secondary | ICD-10-CM

## 2019-12-13 DIAGNOSIS — F419 Anxiety disorder, unspecified: Secondary | ICD-10-CM

## 2019-12-13 DIAGNOSIS — F988 Other specified behavioral and emotional disorders with onset usually occurring in childhood and adolescence: Secondary | ICD-10-CM

## 2019-12-13 MED ORDER — LOSARTAN POTASSIUM 50 MG PO TABS *I*
100.0000 mg | ORAL_TABLET | Freq: Every day | ORAL | 3 refills | Status: DC
Start: 2019-12-13 — End: 2020-12-24

## 2019-12-13 MED ORDER — ATOMOXETINE HCL 40 MG PO CAPS *I*
40.0000 mg | ORAL_CAPSULE | Freq: Every day | ORAL | 3 refills | Status: DC
Start: 2019-12-13 — End: 2020-01-30

## 2019-12-14 NOTE — Progress Notes (Signed)
Patient presents for  1.  Hypertension-he had started to feel off at work the other day just very tense kind of fuzzy in the head and the little bit out of it.  He went to the nurse to check a blood pressure.  It was elevated with systolic reading above 536.  They had him rest for a few moments and did a recheck systolic reading was still above 100 so we decided to go home.  He rested at home recheck later was a little bit lower but still not to goal.  He has been on his losartan at 50 mg and the hydrochlorothiazide at 25 mg.  He has not changed his dose there.  He does not feel there is been any recent change in activity level, sodium intake.  Weight is similar to April's weight.  Main thing he can think of that might be contributing to his elevated blood pressure his stress level.  With the elevated blood pressure he had no shortness of breath.  No real chest pain just sort of a tight feeling in the upper body.  No focal neurologic symptoms either.  2.  Stress/anxiety-he is finding that this year's been particularly challenging at work.  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  Involved in a lot of the discipline they are constantly breaking up fights.  He thinks the year with the kids out of school has made the transition difficult.  Across the board behaviors are much worse, parents are more stressed and harder to deal with.  Is been building over the year but is giving near panic level of anxiety thinking about going into work.  He feels it is affecting his ability to perform his job.  Prior to heading back to school we had reviewed his anxiety thought it was under good control on the escitalopram but that was before starting the in class/in person school year.  His wife echoes his concerns she sees a very high level of stress he has difficulty relaxing even in the evening hours.  He is becoming much more irritable and argumentative.  Just cannot seem to relax.  We discussed work options.  He agrees that  his current level of stress and with his blood pressure where it is that he feels he is not safe to return to work.  He is actually eligible for retirement in February although his initial plan was to try to finish out this year and possibly another.  He does not want to leave work entirely because he feels he has an obligation to the current team but he also does not feel currently he is able to work.  We discussed some options here-recommended that he recontact the therapist that he worked with in the past and start working with them.  He is on maximum dose of escitalopram so I do not think we can adjust that dose.  Given that he had some benefit from that before and the main changes the situation and not certain that the medication adjustment is warranted.  Adding onto that would be adding a new medication such as Abilify or similar medications but those do carry some possible side effects and hearing what the other use of those medications is for obviously is a negative to him.    MEDICAL PROBLEMS:  Patient Active Problem List   Diagnosis Code    Anxiety F41.9    Depression F32.9    ADD (attention deficit disorder) F98.8    Unspecified  essential hypertension I10    Obesity E66.9    OSA (obstructive sleep apnea) G47.33    Erectile dysfunction N52.9    Other cervical disc degeneration at C6-C7 level M50.323        CURRENT MEDICATIONS:  Current Outpatient Medications   Medication Sig    losartan (COZAAR) 50 MG tablet Take 2 tablets (100 mg total) by mouth daily    hydroCHLOROthiazide (HYDRODIURIL) 25 MG tablet TAKE 1 TABLET BY MOUTH EVERY DAY IN THE MORNING    escitalopram (LEXAPRO) 20 MG tablet TAKE 1 TABLET BY MOUTH EVERY DAY    fenofibrate (TRICOR) 48 MG tablet Take 1 tablet (48 mg total) by mouth daily    auto-titrating CPAP (AUTOSET) machine S10 AutoCPAP with min pres of 5 cm-max pres of 18 cm. Dispense with humidifier,heated tubing, & all required equipment. Duration: lifetime    atomoxetine  (STRATTERA) 40 MG capsule Take 1 capsule (40 mg total) by mouth daily    tadalafil (CIALIS) 10 MG tablet Take 1 tablet (10 mg total) by mouth daily as needed for Erectile Dysfunction Take at least 30 minutes prior to sexual activity.     No current facility-administered medications for this visit.        The patient's medications and allergies were reviewed, reconciled, updated as needed and changes were made.    ALLERGIES:  Allergies: No Known Allergies (drug, envir, food or latex)     EXAMINATION:  Vitals:    12/13/19 1301   BP: (!) 130/94   Pulse: 79   Temp: 36.3 C (97.3 F)   TempSrc: Temporal   SpO2: 98%   Weight: 113.3 kg (249 lb 12.8 oz)   Height: 1.727 m (5\' 8" )      Body mass index is 37.98 kg/m.    Generally calm.  Normal speech.  Normal thought content.  Heart shows a regular rate and rhythm.  Lungs are clear to auscultation.      ASSESSMENT/PLAN:  1.  Hypertension-although it may be stress-induced with the diastolic number being where it is I do recommend we move up his medication slightly.  Certainly if we get the stress and anxiety under control and blood pressure drops further we can back off on her dose.  We will move him up to losartan 100 mg a day and continue the hydrochlorothiazide at his current dose.  2.  Situational anxiety-we decided some time away from work to help him decompress a little bit would be warranted.  We will have him start working with his therapist.  Tentative plan would be a 4-week absence and then perhaps returning to work but may be on a limited schedule for him a couple days off during the week to start with.  If he tolerates that schedule we can move him back up to a full schedule.  About 10 years ago he actually ran into a similar situation and had to change that job.  It may be time to consider either retirement or career change for him but he will use this month to trying to work through some of those considerations for himself.  3.  ADD-did recommend we come off the  Ritalin as that may be contributing to some of his blood pressure and stress reaction.  He does feel he needs help with the focus so we will make a switch to Strattera.  Hopefully that will have less of the cardiovascular negative effects.  Starting at 40 mg a day for 3 days and then  moving up to twice daily.

## 2020-01-11 ENCOUNTER — Ambulatory Visit: Payer: PRIVATE HEALTH INSURANCE | Admitting: Primary Care

## 2020-01-11 ENCOUNTER — Encounter: Payer: Self-pay | Admitting: Primary Care

## 2020-01-11 VITALS — BP 128/96 | HR 54 | Ht 68.0 in | Wt 249.0 lb

## 2020-01-11 DIAGNOSIS — Z23 Encounter for immunization: Secondary | ICD-10-CM

## 2020-01-11 MED ORDER — AMLODIPINE BESYLATE 2.5 MG PO TABS *I*
2.5000 mg | ORAL_TABLET | Freq: Every day | ORAL | 5 refills | Status: DC
Start: 2020-01-11 — End: 2020-02-03

## 2020-01-11 NOTE — Progress Notes (Signed)
Patient presents for follow-up on  Hypertension-which is losartan 100 mg.  He actually increase his hydrochlorothiazide to 2 tablets as well some increased urination on that.  Blood pressures at home seem to be around 120-130 over mid 80s to 90.  No adverse effects from the additional medicine other than the urination.  He is taken him out of work due to the work stressors.  He thinks he is ready to return.  He has been working with his counselor and had a few sessions with them.  He like to try to go back to full-time we had discussed the option of maybe to partial schedule but he like to see how he does.  He has been doing some walking and he just feels overall little bit calmer.  We also switched him off Ritalin and put him on Strattera.  He is not certain if it is quite as good as the Ritalin for his focus but he sees some help of there.        MEDICAL PROBLEMS:  Patient Active Problem List   Diagnosis Code    Anxiety F41.9    Depression F32.A    ADD (attention deficit disorder) F98.8    Unspecified essential hypertension I10    Obesity E66.9    OSA (obstructive sleep apnea) G47.33    Erectile dysfunction N52.9    Other cervical disc degeneration at C6-C7 level M50.323        CURRENT MEDICATIONS:  Current Outpatient Medications   Medication Sig    losartan (COZAAR) 50 MG tablet Take 2 tablets (100 mg total) by mouth daily    atomoxetine (STRATTERA) 40 MG capsule Take 1 capsule (40 mg total) by mouth daily    hydroCHLOROthiazide (HYDRODIURIL) 25 MG tablet TAKE 1 TABLET BY MOUTH EVERY DAY IN THE MORNING    escitalopram (LEXAPRO) 20 MG tablet TAKE 1 TABLET BY MOUTH EVERY DAY    tadalafil (CIALIS) 10 MG tablet Take 1 tablet (10 mg total) by mouth daily as needed for Erectile Dysfunction Take at least 30 minutes prior to sexual activity.    fenofibrate (TRICOR) 48 MG tablet Take 1 tablet (48 mg total) by mouth daily    auto-titrating CPAP (AUTOSET) machine S10 AutoCPAP with min pres of 5 cm-max pres of  18 cm. Dispense with humidifier,heated tubing, & all required equipment. Duration: lifetime    amLODIPine (NORVASC) 2.5 mg tablet Take 1 tablet (2.5 mg total) by mouth daily     No current facility-administered medications for this visit.        The patient's medications and allergies were reviewed, reconciled, updated as needed and changes were made.    ALLERGIES:  Allergies: No Known Allergies (drug, envir, food or latex)     EXAMINATION:  Vitals:    01/11/20 1105   BP: (!) 120/100   Pulse: 54   SpO2: 96%   Weight: 112.9 kg (249 lb)   Height: 1.727 m (5\' 8" )      Body mass index is 37.86 kg/m.      Lungs are clear, no wheeze, no rales  Card exam shows a rrr, nl S1, S2, no murmurs  Ext show no edema. Good DP pulses.        ASSESSMENT/PLAN:  Hypertension-not to goal.  Home readings are better but still not optimal and since he is heading back to that stressful circumstance of work I think recurrent to see higher pressures in that situation.  He is not enamored of adding yet  another medication but I think it is warranted.  We discussed amlodipine did review the low rate of side effects of that medication.  Starting him on 2.5 mg a day.    Anxiety-he is doing well with his counselor.  On Lexapro 20 mg.  Plan is on Monday to have him try back at work, we will see if he can tolerate the full time schedule.  He will let me know if we need to do back off on that to a partial schedule.

## 2020-01-27 ENCOUNTER — Other Ambulatory Visit: Payer: Self-pay | Admitting: Primary Care

## 2020-01-27 NOTE — Telephone Encounter (Signed)
lov 12/13/19  No fuv  Labs 06/08/19

## 2020-01-30 ENCOUNTER — Other Ambulatory Visit: Payer: Self-pay | Admitting: Primary Care

## 2020-01-30 ENCOUNTER — Telehealth: Payer: Self-pay | Admitting: Primary Care

## 2020-01-30 ENCOUNTER — Encounter: Payer: Self-pay | Admitting: Primary Care

## 2020-01-30 MED ORDER — ATOMOXETINE HCL 40 MG PO CAPS *I*
80.0000 mg | ORAL_CAPSULE | Freq: Every day | ORAL | 3 refills | Status: DC
Start: 2020-01-30 — End: 2021-02-15

## 2020-01-30 NOTE — Telephone Encounter (Signed)
LOV  10.27.21  NOV  LABS  3.24.21

## 2020-01-30 NOTE — Telephone Encounter (Signed)
Dr. Lorna Dibble,  So apparently I misunderstood the directions for taking my Stratera.  I ran out of meds yesterday and went to refill, only to be told it was too early.  The mistake was mine.  I was taking two tablets a day instead of one.  I think at one point we were going with two 20 mg pills a day...instead I was taking two 40's, which is probably way too much.  Your thoughts on addressing this?  Sorry for any inconvenience,  Ronald Hughes

## 2020-01-30 NOTE — Telephone Encounter (Signed)
He is actually correct, we did start with one a day, but told him to move it up to two a day after a few days. The mistake was in the script, it didn't reflect the change in directions, Sent in a new script with correct directions so hopefully they can fill that.

## 2020-01-31 NOTE — Telephone Encounter (Signed)
I sent a my chart message.

## 2020-02-03 ENCOUNTER — Other Ambulatory Visit: Payer: Self-pay | Admitting: Primary Care

## 2020-02-03 MED ORDER — AMLODIPINE BESYLATE 2.5 MG PO TABS *I*
2.5000 mg | ORAL_TABLET | Freq: Every day | ORAL | 1 refills | Status: DC
Start: 2020-02-03 — End: 2020-07-30

## 2020-02-03 NOTE — Telephone Encounter (Signed)
Last appointment: 01/11/2020   Next appointment: nothing scheduled

## 2020-02-18 ENCOUNTER — Encounter: Payer: Self-pay | Admitting: Emergency Medicine

## 2020-02-18 ENCOUNTER — Ambulatory Visit
Admission: AD | Admit: 2020-02-18 | Discharge: 2020-02-18 | Disposition: A | Discharge status: Home / Self Care | Payer: PRIVATE HEALTH INSURANCE | Source: Ambulatory Visit | Attending: Emergency Medicine | Admitting: Emergency Medicine

## 2020-02-18 DIAGNOSIS — U071 COVID-19: Secondary | ICD-10-CM | POA: Insufficient documentation

## 2020-02-18 DIAGNOSIS — J069 Acute upper respiratory infection, unspecified: Secondary | ICD-10-CM | POA: Insufficient documentation

## 2020-02-18 NOTE — Discharge Instructions (Signed)
If you have been tested for the virus that causes COVID-19, you must stay at home and isolate yourself until the test results are returned.   Isolation/quarantine means to stay separate from other people, so that sickness is not spread. Depending on your test results, you will either be allowed to return to regular activity, or will have to continue isolation/quarantine until the Health Department instructs you to stop. Your Local Health Department will be contacting you to verify that you have received and can follow these  instructions. If you have not been contacted, please call your Local Health Department immediately    Avoid public areas:   Do not go to work, school or any public areas. This includes, but is not limited to, activities such as going to the grocery store, walking the dog, visiting the laundromat, going to the movies, picking up food, and attending church. STAY HOME.    Avoid public transportation.   Stay off of all public transportation like buses, subways, trains and planes; ride-sharing like Uber or Lyft; or taxis. STAY HOME.    Separate yourself from other people and animals in your home:  Stay away from others, even your partner or children: As much as possible, stay in a specific room and keep away from other people in your home. Use a separate bathroom from the rest of your household. If you must share a bathroom, someone in your household will need to clean the bathroom every time you use it by disinfecting door knobs, bathroom fixtures and other “hightouch” surfaces.

## 2020-02-18 NOTE — UC Provider Note (Signed)
Chief Complaint:   Chief Complaint   Patient presents with    COVID-19 Concern     pt reports + exposure to friends at a wedding last weekend. pt reports fever, chills, cough, congestion that started wednesday, would like testing.         HPI:  54 y.o. male w hx as noted in chart presents to Urgent Care with symptoms concerning for COVID 19. Patient complains of 3 day(s) of the following symptoms: congestion, headache  Symptoms have remained the same     Known sick contacts / COVID 19 exposures: yes  Recent travel: no  COVID 19 vaccination completed >2 weeks ago: yes    VITALS:  BP 149/88 (BP Location: Left arm)    Pulse 58    Temp 36.1 C (97 F) (Temporal)    SpO2 99%      ROS: see above / below, otherwise complete ROS negative   Fever: no  Body aches: no  Upper respiratory congestion: yes  Shortness of breath: no  Cough: no     PHYSICAL EXAM:  Appearance: Well appearing, no acute distress   Nose: normal   Throat: normal  Neck: no cervical LAD or tenderness, soft  CV: RRR, no murmurs   Pulm: Lungs Clear to ausculation bilaterally, no acute respiratory distress     UC LABS:  Labs Reviewed   COVID-19 PCR   COVID-19 PCR       No results found for this visit on 02/18/20.     MDM:  54 y.o. male w hx as noted in chart presents with symptoms concerning for COVID-19 vs other mild viral illness. COVID 19 PCR testing ordered (see above if other labs ordered). The patient was informed the COVID 19 result will be available in MyChart and the need for isolation per CDC guidelines. Recommend supportive care at this time. Advised close follow up w PCP as needed. Appropriate Handouts provided.     Given the patient's reassuring vital signs and overall well appearance, the patient can be managed safely at home. Hydration advised. Patient advised to go to ED for any worsening or concerning symptoms.        Darl Householder, NP  02/18/20 [phone]

## 2020-02-18 NOTE — ED Triage Notes (Signed)
COVID-19 Concern pt reports + exposure to friends at a wedding last weekend. pt reports fever, chills, cough, congestion that started wednesday, would like testing.

## 2020-02-19 LAB — COVID-19 PCR

## 2020-02-19 LAB — COVID-19 NAAT (PCR): COVID-19 NAAT (PCR): POSITIVE — AB

## 2020-02-20 ENCOUNTER — Telehealth: Payer: Self-pay

## 2020-02-20 NOTE — Telephone Encounter (Signed)
Let him know he is positive, hopefully since he was immunized will have a less severe course, but let us know if he feels breathing is getting bad.

## 2020-02-20 NOTE — Telephone Encounter (Signed)
Received notification that Ronald Hughes was seen at [facility] on 02/18/2020 with the following complaints:   pt reports + exposure to friends at a wedding last weekend. pt reports fever, chills, cough, congestion that started wednesday, would like testing.      He has tested positive for Covid.

## 2020-02-20 NOTE — Telephone Encounter (Signed)
I spoke to the patient and advised him  know he is positive, hopefully since he was immunized will have a less severe course, but let us know if he feels breathing is getting bad.

## 2020-03-26 ENCOUNTER — Other Ambulatory Visit: Payer: Self-pay | Admitting: Primary Care

## 2020-03-26 MED ORDER — TADALAFIL 10 MG PO TABS *I*
10.0000 mg | ORAL_TABLET | Freq: Every day | ORAL | 5 refills | Status: DC | PRN
Start: 2020-03-26 — End: 2020-12-15

## 2020-03-26 NOTE — Telephone Encounter (Signed)
LOV - 01-11-20  NOV - NONE  LABS - 06-08-19

## 2020-05-04 ENCOUNTER — Ambulatory Visit
Admission: AD | Admit: 2020-05-04 | Discharge: 2020-05-04 | Disposition: A | Discharge status: Home / Self Care | Payer: PRIVATE HEALTH INSURANCE | Source: Ambulatory Visit | Attending: Family Medicine | Admitting: Family Medicine

## 2020-05-04 ENCOUNTER — Encounter: Payer: Self-pay | Admitting: Family Medicine

## 2020-05-04 DIAGNOSIS — Z20822 Contact with and (suspected) exposure to covid-19: Secondary | ICD-10-CM | POA: Insufficient documentation

## 2020-05-04 DIAGNOSIS — R0981 Nasal congestion: Secondary | ICD-10-CM | POA: Insufficient documentation

## 2020-05-04 DIAGNOSIS — R059 Cough, unspecified: Secondary | ICD-10-CM | POA: Insufficient documentation

## 2020-05-04 DIAGNOSIS — H6503 Acute serous otitis media, bilateral: Secondary | ICD-10-CM | POA: Insufficient documentation

## 2020-05-04 DIAGNOSIS — Z20828 Contact with and (suspected) exposure to other viral communicable diseases: Secondary | ICD-10-CM | POA: Insufficient documentation

## 2020-05-04 DIAGNOSIS — J069 Acute upper respiratory infection, unspecified: Secondary | ICD-10-CM

## 2020-05-04 MED ORDER — LORATADINE 10 MG PO TABS *I*
10.0000 mg | ORAL_TABLET | Freq: Every day | ORAL | 0 refills | Status: AC
Start: 2020-05-04 — End: 2020-06-03

## 2020-05-04 MED ORDER — FLUTICASONE PROPIONATE 50 MCG/ACT NA SUSP *I*
NASAL | 0 refills | Status: DC
Start: 2020-05-04 — End: 2021-08-02

## 2020-05-04 NOTE — ED Triage Notes (Signed)
Pt reports nasal congestion, and mild intermittent cough, for past week that he's been taking mucinex for. Pt states that overnight he developed right ear pain. No drainage from ear, muffled hearing, fever.   Current state of emergency related to COVID-19. Documentation completed per hospital emergency response standard.

## 2020-05-04 NOTE — UC Provider Note (Signed)
Chief Complaint:   Chief Complaint   Patient presents with    URI     Pt reports nasal congestion, and mild intermittent cough, for past week that he's been taking mucinex for. Pt states that overnight he developed right ear pain. No drainage from ear, muffled hearing, fever.     Otalgia        HPI:  55 y.o. male w hx as noted in chart presents to Urgent Care with symptoms concerning for COVID 19. Patient complains of several  day(s) of the following symptoms: nasal congestion, rhinorrhea and occasional cough;  Today right ear pain;  No fever, chills, SOB, CP;  No known covid or other sick contacts;  COVID vaccinated x2; Mucinex with mild relief.   Symptoms have remained the same         VITALS:  BP 130/88 (BP Location: Left arm)    Pulse 92    Temp 36 C (96.8 F) (Temporal)    Resp 18    SpO2 98%      ROS: see above / below, otherwise complete ROS negative     PHYSICAL EXAM:  Appearance: Well appearing, no acute distress   Nose: mucosal edema and congestion   Throat: normal  TMs; bilateral middle ear effusion  Neck: no cervical LAD or tenderness, soft  Pulm: Lungs Clear to ausculation bilaterally, no acute respiratory distress     UC LABS:  Labs Reviewed   INFLUENZA A   INFLUENZA B PCR   RSV PCR   COVID-19 PCR       No results found for this visit on 05/04/20.     MDM:  55 y.o. male w hx as noted in chart presents with symptoms concerning for COVID-19 vs other mild viral illness. COVID 19 PCR testing ordered (see above if other labs ordered). The patient was informed the COVID 19 result will be available in MyChart and the need for isolation per CDC guidelines. Recommend supportive care at this time. Advised close follow up w PCP as needed. Appropriate Handouts provided.     Given the patient's reassuring vital signs and overall well appearance, the patient can be managed safely at home. Hydration advised. Patient advised to go to ED for any worsening or concerning symptoms.    Routine written and verbal  instructions and alert signs/symptoms given to patient and emphasized.     Claritin and flonase nasal spray            Eugenie Norrie, MD  05/04/20 [phone]

## 2020-05-05 LAB — COVID-19 NAAT (PCR): COVID-19 NAAT (PCR): NEGATIVE

## 2020-05-05 LAB — INFLUENZA B PCR: Influenza B PCR: 0

## 2020-05-05 LAB — INFLUENZA A: Influenza A PCR: 0

## 2020-05-05 LAB — RSV PCR: RSV PCR: 0

## 2020-05-05 LAB — COVID-19 PCR

## 2020-05-10 ENCOUNTER — Other Ambulatory Visit: Payer: Self-pay | Admitting: Primary Care

## 2020-05-10 NOTE — Telephone Encounter (Signed)
LOV  10.27.21  NOV  LABS  3.24.21

## 2020-07-29 ENCOUNTER — Other Ambulatory Visit: Payer: Self-pay | Admitting: Primary Care

## 2020-07-30 NOTE — Telephone Encounter (Signed)
Last seen 01-11-20, nothing sched  Labs 06-08-19,

## 2020-11-08 ENCOUNTER — Other Ambulatory Visit: Payer: Self-pay | Admitting: Primary Care

## 2020-11-08 NOTE — Telephone Encounter (Signed)
Last seen 01-11-20, nothing sched  Labs 06-08-19, I stop checked

## 2020-12-15 ENCOUNTER — Other Ambulatory Visit: Payer: Self-pay | Admitting: Primary Care

## 2020-12-17 MED ORDER — TADALAFIL 10 MG PO TABS *I*
10.0000 mg | ORAL_TABLET | Freq: Every day | ORAL | 5 refills | Status: DC | PRN
Start: 2020-12-17 — End: 2022-02-02

## 2020-12-17 NOTE — Telephone Encounter (Signed)
LOV  10.27.21  NOV  LABS  3.24.21

## 2020-12-23 ENCOUNTER — Other Ambulatory Visit: Payer: Self-pay | Admitting: Primary Care

## 2020-12-24 NOTE — Telephone Encounter (Signed)
lv 10.27.22  nv none  Labs 4.1.21

## 2021-01-02 ENCOUNTER — Telehealth: Payer: Self-pay | Admitting: Primary Care

## 2021-01-02 NOTE — Telephone Encounter (Signed)
Pt needs htn appointment with sean 40 min on Monday mornings or sam and laura on tuesdays 20 min each     Pt's last htn appointment on 9.8.21 he is overdue

## 2021-01-21 ENCOUNTER — Other Ambulatory Visit: Payer: Self-pay | Admitting: Primary Care

## 2021-01-21 NOTE — Telephone Encounter (Signed)
LOV  10.27.21  NOV  LABS  3.24.21

## 2021-01-22 NOTE — Telephone Encounter (Signed)
I left a message for the patient to call the office.

## 2021-01-22 NOTE — Telephone Encounter (Signed)
Last seen 01-11-20, nothing sched  Labs 06-16-19

## 2021-01-22 NOTE — Telephone Encounter (Signed)
Requested script for store [phone], not sure what that is, which pharmacy, can we get more info

## 2021-02-04 ENCOUNTER — Other Ambulatory Visit: Payer: Self-pay | Admitting: Primary Care

## 2021-02-04 MED ORDER — FENOFIBRATE 48 MG PO TABS *I*
48.0000 mg | ORAL_TABLET | Freq: Every day | ORAL | 5 refills | Status: DC
Start: 2021-02-04 — End: 2021-10-14

## 2021-02-04 NOTE — Telephone Encounter (Signed)
LV   01/02/21     NV   Nothing scheduled     LABS   06/08/19

## 2021-02-15 ENCOUNTER — Other Ambulatory Visit: Payer: Self-pay | Admitting: Primary Care

## 2021-02-15 NOTE — Telephone Encounter (Signed)
Last seen 01-11-20, sched nothing  Labs 06-08-19

## 2021-02-26 ENCOUNTER — Other Ambulatory Visit: Payer: Self-pay | Admitting: Primary Care

## 2021-02-26 MED ORDER — ESCITALOPRAM OXALATE 20 MG PO TABS *I*
20.0000 mg | ORAL_TABLET | Freq: Every day | ORAL | 3 refills | Status: DC
Start: 2021-02-26 — End: 2021-05-31

## 2021-02-26 NOTE — Telephone Encounter (Signed)
Last seen 01-11-20, nothing sched  Labs 06-08-19, I stop checked

## 2021-05-15 ENCOUNTER — Other Ambulatory Visit: Payer: Self-pay | Admitting: Primary Care

## 2021-05-15 NOTE — Telephone Encounter (Signed)
LOV - 08-20-20  NOV - NONE  LABS - 06-08-19

## 2021-05-31 ENCOUNTER — Other Ambulatory Visit: Payer: Self-pay | Admitting: Primary Care

## 2021-05-31 MED ORDER — ESCITALOPRAM OXALATE 20 MG PO TABS *I*
20.0000 mg | ORAL_TABLET | Freq: Every day | ORAL | 3 refills | Status: DC
Start: 2021-05-31 — End: 2021-08-24

## 2021-05-31 NOTE — Telephone Encounter (Signed)
Last seen 01-11-20, nothing sched  Labs 06-16-19, I stop checked

## 2021-07-17 ENCOUNTER — Other Ambulatory Visit: Payer: Self-pay | Admitting: Primary Care

## 2021-07-17 NOTE — Telephone Encounter (Signed)
I left a detailed message that he is over due for a his follow up and to call the office to schedule something before his next refill so theres no interruptions.

## 2021-07-17 NOTE — Telephone Encounter (Signed)
Refilling med but overdue for a follow up, have him set one up.

## 2021-07-17 NOTE — Telephone Encounter (Signed)
lv 10.27.21  nv none  Labs 3.24.21

## 2021-08-02 ENCOUNTER — Other Ambulatory Visit: Payer: Self-pay

## 2021-08-02 ENCOUNTER — Ambulatory Visit: Payer: PRIVATE HEALTH INSURANCE | Admitting: Primary Care

## 2021-08-02 VITALS — BP 134/88

## 2021-08-02 DIAGNOSIS — G4733 Obstructive sleep apnea (adult) (pediatric): Secondary | ICD-10-CM

## 2021-08-02 DIAGNOSIS — I10 Essential (primary) hypertension: Secondary | ICD-10-CM

## 2021-08-02 DIAGNOSIS — Z0000 Encounter for general adult medical examination without abnormal findings: Secondary | ICD-10-CM

## 2021-08-02 DIAGNOSIS — F419 Anxiety disorder, unspecified: Secondary | ICD-10-CM

## 2021-08-02 LAB — PCMH DEPRESSION ASSESSMENT

## 2021-08-02 NOTE — Progress Notes (Signed)
Patient presents for routine follow-up.  1.  Hypertension-he was in urgent care center recently and got a reading of 130/92.  He is regular with his medications.  Not reporting adverse effects from his amlodipine 2.5, hydrochlorothiazide 25 or losartan 50.  He is just trying to get into a regimen now he started doing some intermittent fasting just over the last couple weeks.  He actually enjoys it and finds it is a good program for him.  Gets to the gym and exercises maybe 2 days a week.  2.  Sleep apnea-his machine is starting to show signs of aging.  He is getting a whistling noise from it.  He be interested in getting a replacement if his insurance plan allows.  Last visit to the sleep center was in 2017.  His device was set at 5 to 18 cm of water with variable pressure.  At that time median pressure was 8.7.  AHI was 1.6 on treatment.  His initial study was done 06/28/2015.  His AHI was 59.8.  Lowest oxygen level was 72%.  3.  Was wondering if he might be able to come off some of his medications.  He is on some escitalopram for anxiety/depression management and then Strattera for focus issues.  He actually feels moods been pretty good for a while and focus is less of an issue.  He is retired from [employer] and now just works part-time job.    Social history:  Bought a house in [place] he lives there most of the year travels back and forth because his wife is still appearing [place].  She is propaganda retire in a couple years so they are going to be in this back-and-forth situation until then.      MEDICAL PROBLEMS:  Patient Active Problem List   Diagnosis Code   . Anxiety F41.9   . Depression F32.A   . ADD (attention deficit disorder) F98.8   . Unspecified essential hypertension I10   . Obesity E66.9   . OSA (obstructive sleep apnea) G47.33   . Erectile dysfunction N52.9   . Other cervical disc degeneration at C6-C7 level M50.323        CURRENT MEDICATIONS:  Current Outpatient Medications    Medication Sig   . amLODIPine (NORVASC) 2.5 mg tablet TAKE 1 TABLET BY MOUTH EVERY DAY   . escitalopram (LEXAPRO) 20 mg tablet Take 1 tablet (20 mg total) by mouth daily   . hydroCHLOROthiazide (HYDRODIURIL) 25 mg tablet TAKE 1 TABLET BY MOUTH EVERY DAY IN THE MORNING   . atomoxetine (STRATTERA) 40 mg capsule TAKE 2 CAPSULES BY MOUTH DAILY   . fenofibrate (TRICOR) 48 mg tablet Take 1 tablet (48 mg total) by mouth daily   . losartan (COZAAR) 50 mg tablet TAKE 1 TABLET BY MOUTH EVERY DAY   . auto-titrating CPAP (AUTOSET) machine S10 AutoCPAP with min pres of 5 cm-max pres of 18 cm. Dispense with humidifier,heated tubing, & all required equipment. Duration: lifetime   . tadalafil (CIALIS) 10 MG tablet Take 1 tablet (10 mg total) by mouth daily as needed for Erectile Dysfunction  Take at least 30 minutes prior to sexual activity.     No current facility-administered medications for this visit.        The patient's medications and allergies were reviewed, reconciled, updated as needed and changes were made.    ALLERGIES:  Allergies: No Known Allergies (drug, envir, food or latex)     EXAMINATION:  Vitals:  08/02/21 0903   BP: 134/88      There is no height or weight on file to calculate BMI.    Carotids are without bruits  Thyroid is nl, no nodules  Lungs are clear, no wheeze, no rales  Card exam shows a rrr, nl S1, S2, no murmurs  Ext show no edema. Good DP pulses.        ASSESSMENT/PLAN:  1.  Hypertension-ideally would like to see his diastolic down to 80.  He thanked to work on his intermittent fasting and see if he can get his weight down and track his blood pressure for me.  Told him to give that about 3 months but if he is not seeing numbers closer to 80 by then let me know and we could move up his amlodipine dose.  Did ask him to repeat some blood work its been a few years since we checked that.  2.  Sleep apnea-prescription written for him to take to [place].  3.  Depression-we will start by having him taper  the escitalopram to 1/2 tablet daily.  If he does well after 3 to 4 weeks of that he can try to come off it entirely.  If he is successful in coming off of that next could try stopping the Strattera.

## 2021-08-24 ENCOUNTER — Other Ambulatory Visit: Payer: Self-pay | Admitting: Primary Care

## 2021-08-26 MED ORDER — ESCITALOPRAM OXALATE 20 MG PO TABS *I*
20.0000 mg | ORAL_TABLET | Freq: Every day | ORAL | 3 refills | Status: DC
Start: 2021-08-26 — End: 2022-11-12

## 2021-08-26 NOTE — Telephone Encounter (Signed)
Last seen 08-02-21, nothing sched  Labs 06-16-19, has future labs

## 2021-08-27 ENCOUNTER — Other Ambulatory Visit: Payer: Self-pay | Admitting: Primary Care

## 2021-08-27 NOTE — Telephone Encounter (Signed)
Last seen 08-02-21, nothing sched  Labs 06-08-19

## 2021-09-26 ENCOUNTER — Encounter: Payer: Self-pay | Admitting: Primary Care

## 2021-09-26 DIAGNOSIS — H532 Diplopia: Secondary | ICD-10-CM

## 2021-10-09 IMAGING — MR MRI ORBITS FACE OR NECK  W/WO CONTRAST
7 of 10 series · 32 of 48 positions shown · IV contrast (gadolinium)
Comparison: None

________________________________________________________________________________________________ 
MRI ORBITS FACE OR NECK  W/WO CONTRAST, MRI BRAIN W/WO CONTRAST, 10/09/2021 [DATE]: 
CLINICAL INDICATION: Diplopia. Double vision for 2 years. No trauma.
TECHNIQUE: Multiplanar, multiecho position MR images of the orbits were 
performed without and with intravenous gadolinium enhancement. 10 mL of Gadovist 
were injected intravenously by hand. 0 mL of Gadovist were discarded. Patient 
was scanned on a 1.5 Tesla magnet.

[Series 101: survey · axial · 10.0mm · 0.94mm/px · z∈[+0,+150]mm · 2 of 5 slices shown]
[im 1/5]
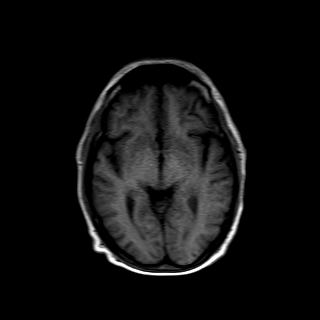
[im 5/5]
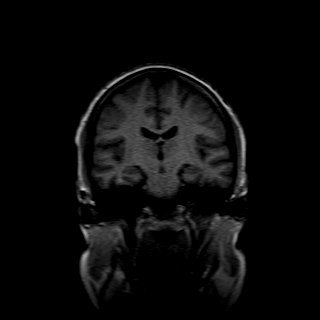

[Series 201: t1_se_sag · sagittal · 4.0mm · 0.49mm/px · 6 of 28 slices shown]
[im 1/28]
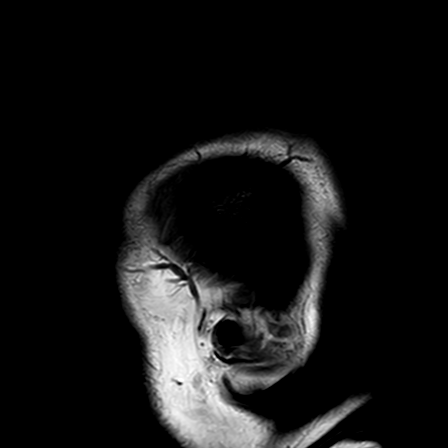
[im 6/28]
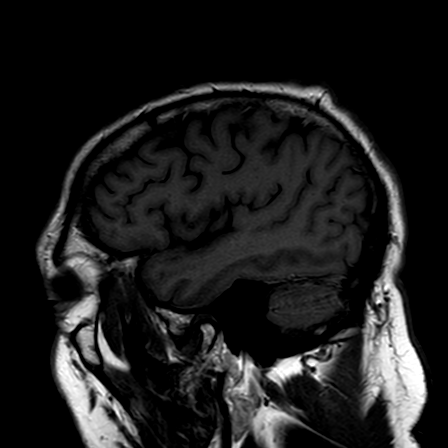
[im 11/28]
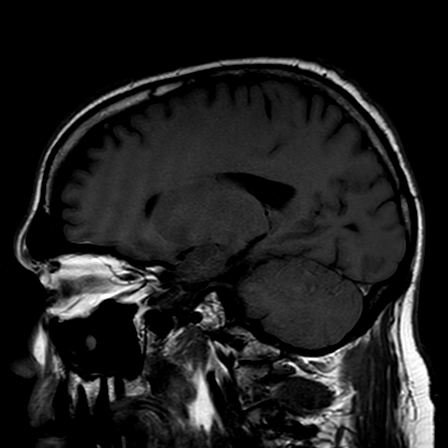
[im 17/28]
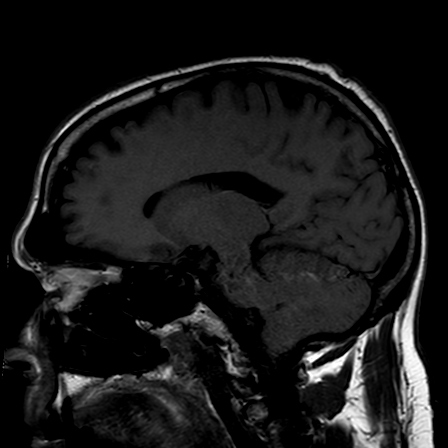
[im 22/28]
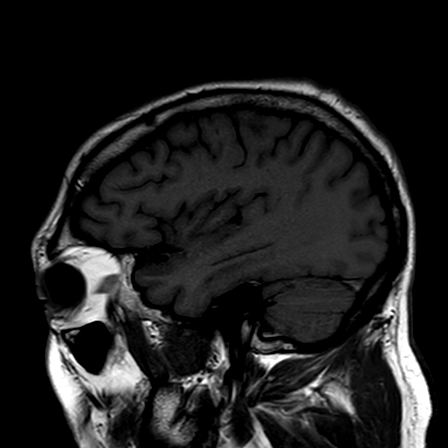
[im 28/28]
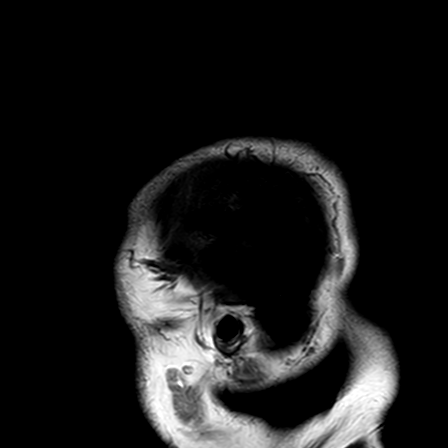

[Series 503: dadc map · axial · 5.0mm · 1.03mm/px · z∈[-76,+80]mm · 5 of 27 slices shown]
[im 1/27]
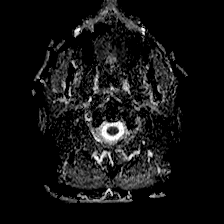
[im 7/27]
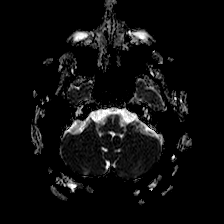
[im 14/27]
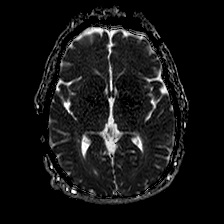
[im 20/27]
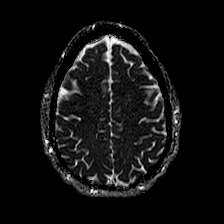
[im 27/27]
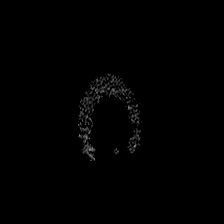

[Series 505: (id) · axial · 5.0mm · 1.03mm/px · z∈[-76,+80]mm · 5 of 27 slices shown]
[im 1/27]
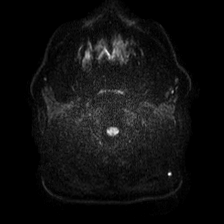
[im 7/27]
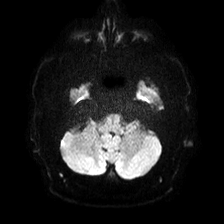
[im 14/27]
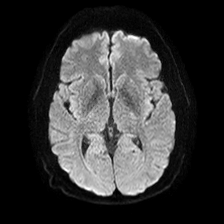
[im 20/27]
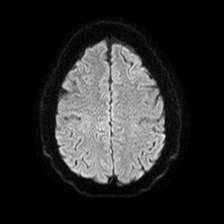
[im 27/27]
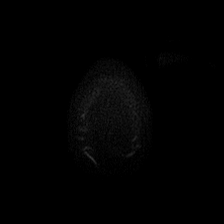

[Series 901: t2w_spir_mvxd · coronal · 3.0mm · 0.48mm/px · 4 of 32 slices shown]
[im 1/32]
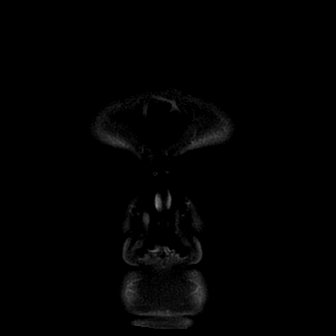
[im 7/32]
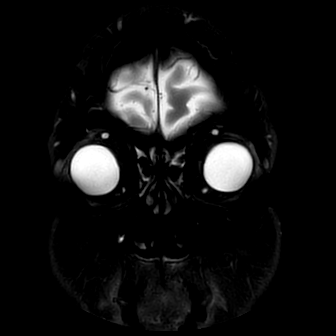
[im 13/32]
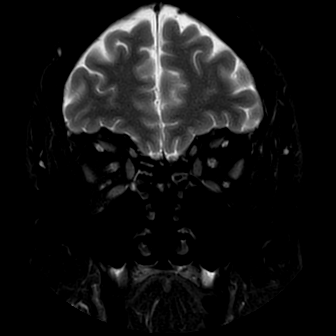
[im 19/32]
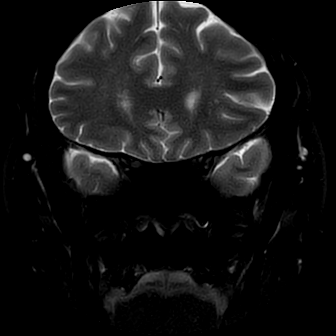

[Series 1501: T1 post-contrast · axial · 2.5mm · 0.33mm/px · z∈[-56,-9]mm · 4 of 20 slices shown (1 of 2)]
[im 1/20]
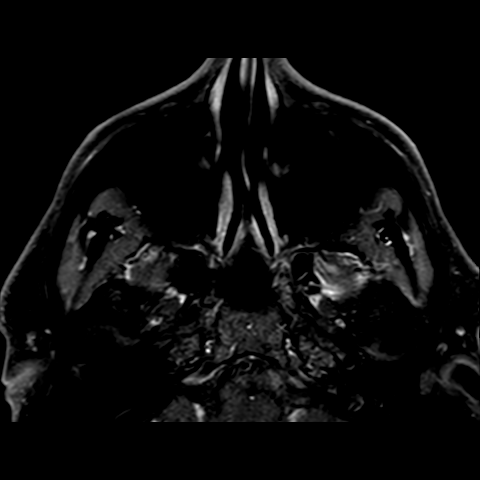
[im 7/20]
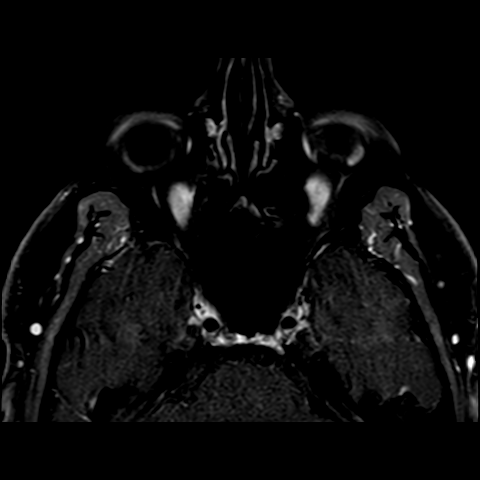
[im 13/20]
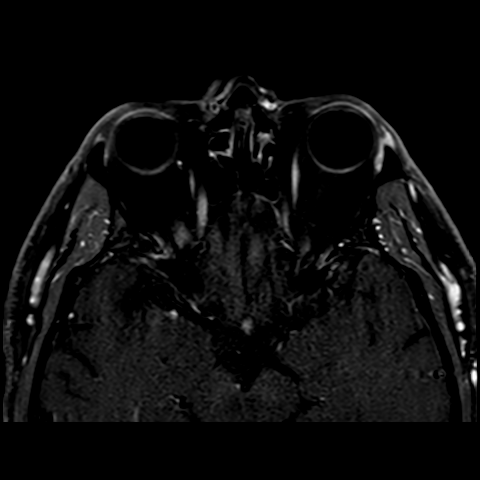
[im 20/20]
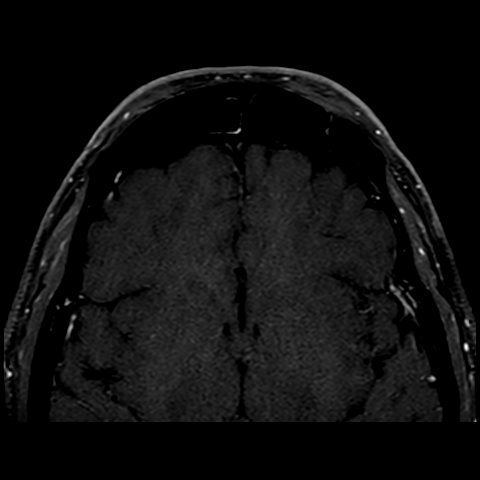

[Series 1601: T1 post-contrast · coronal · 3.0mm · 0.35mm/px · 6 of 32 slices shown (2 of 2)]
[im 1/32]
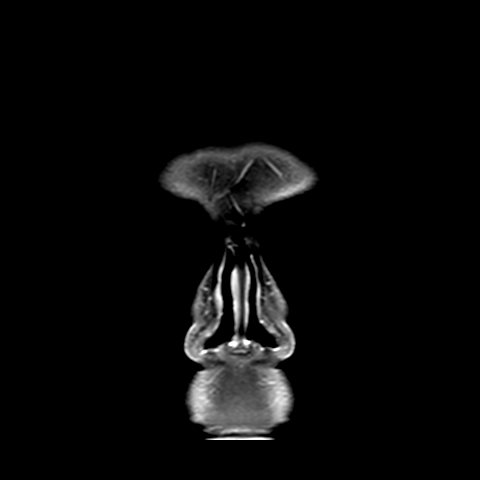
[im 7/32]
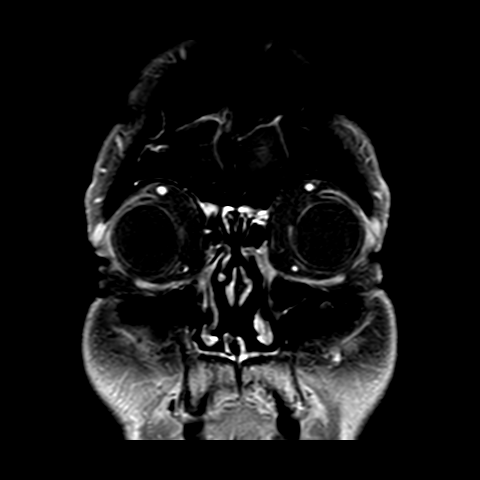
[im 13/32]
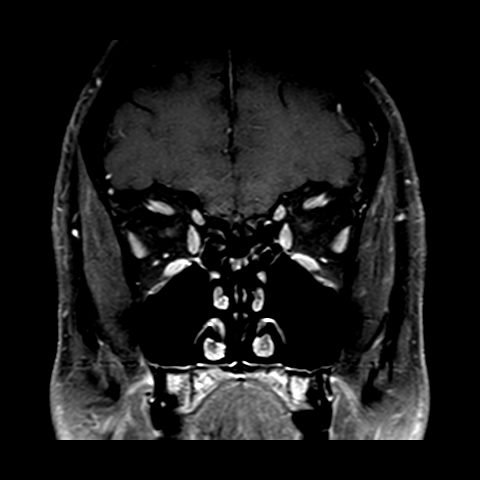
[im 19/32]
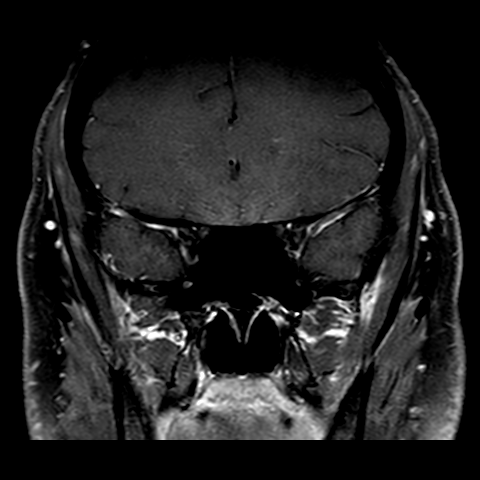
[im 25/32]
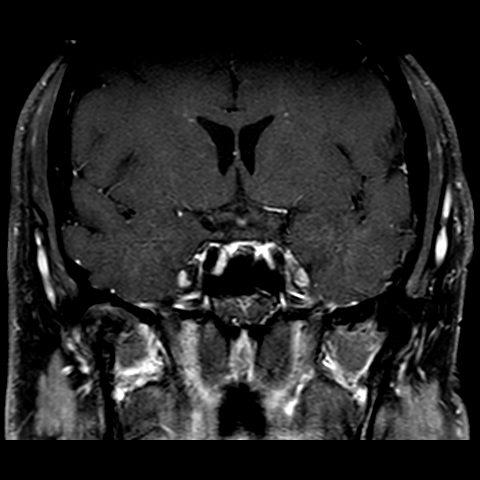
[im 32/32]
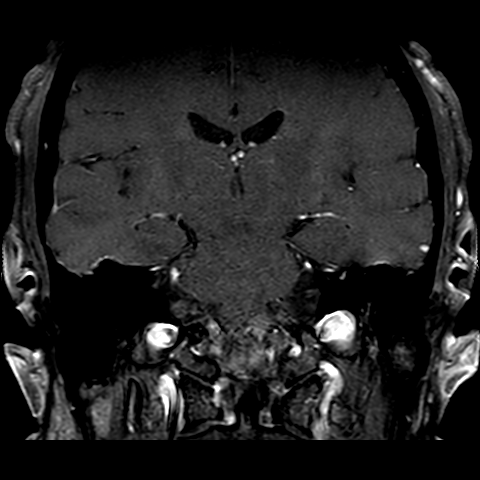

[32 of 48 positions shown; findings below may reference images not displayed]

FINDINGS: -------------------------------------------------------------------------------- 
---- 
ORBITS/FACE: Images of the orbit are mildly degraded by motion artifact. 
Imaging of the orbits demonstrates intact globes. Symmetrical extraocular 
musculature without thickening/enlargement. The intraorbital, canalicular and 
prechiasmatic optic nerve segments are normal in size and signal intensity 
bilaterally. The optic chiasm and proximal optic tracts are unremarkable. No 
localized inflammation of the intraconal or extraconal orbital fat. Normal 
lacrimal glands. No intraorbital mass or pathologic intraorbital enhancement. 
Facial soft tissues demonstrate no mass or adenopathy. 
-------------------------------------------------------------------------------- 
--- 
INTRACRANIAL:  
The cerebellar tonsils are minimally low lying, residing at the level the 
foramen magnum. No upper cervical cord syrinx. Brain parenchymal signal pattern 
is essentially normal for age with minimal periventricular and pontine T-2 FLAIR 
hyperintensity noted. Findings most likely related to mild chronic small vessel 
vascular change. No mass or abnormal extra-axial fluid collection. Normal brain 
parenchymal volume without hydrocephalus. 
No acute ischemia.  No abnormal foci of susceptibility artifact in the brain. 
Patency of the intracranial vascular flow voids.  No acute intracranial 
hemorrhage, mass effect, midline shift. No pathologic contrast enhancement. 
-------------------------------------------------------------------------------- 
-- 
OTHER: 
SINUSES/T-BONES:  Small left mastoid effusion. No middle ear or right mastoid 
effusion.  Mild right maxillary membrane thickening. Mild bifrontal membrane 
thickening. 
MARROW SIGNAL/SOFT TISSUES: No focal suspect signal abnormality. 
-------------------------------------------------------------------------------- 
-
IMPRESSION: Images of the orbits are mildly degraded by motion artifact, although no orbital 
pathology is identified. 
Minimally low lying cerebellar tonsils residing at the level the foramen magnum. 
No upper cervical cord syrinx is suspected. 
No acute intracranial process with mild presumed chronic appearing brain 
parenchymal changes detailed above. 
Small left mastoid effusion. 
Mild paranasal membrane thickening.

## 2021-10-09 IMAGING — MR MRI BRAIN W/WO CONTRAST
6 of 12 series · 19 of 48 positions shown · IV contrast (gadolinium)
Comparison: None

________________________________________________________________________________________________ 
MRI ORBITS FACE OR NECK  W/WO CONTRAST, MRI BRAIN W/WO CONTRAST, 10/09/2021 [DATE]: 
CLINICAL INDICATION: Diplopia. Double vision for 2 years. No trauma.
TECHNIQUE: Multiplanar, multiecho position MR images of the orbits were 
performed without and with intravenous gadolinium enhancement. 10 mL of Gadovist 
were injected intravenously by hand. 0 mL of Gadovist were discarded. Patient 
was scanned on a 1.5 Tesla magnet.

[Series 101: survey · axial · 10.0mm · 0.94mm/px · z∈[+0,+150]mm · 2 of 5 slices shown]
[im 1/5]
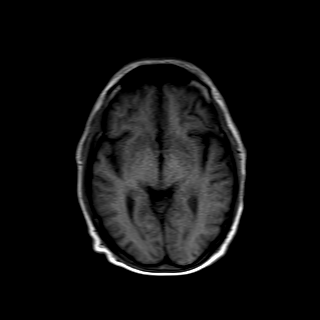
[im 5/5]
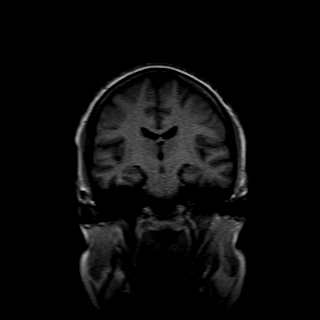

[Series 201: t1_se_sag · sagittal · 4.0mm · 0.49mm/px · 2 of 28 slices shown]
[im 1/28]
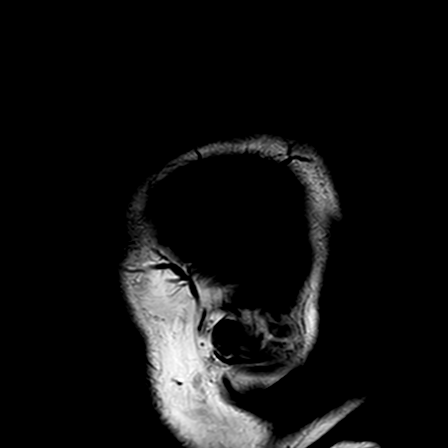
[im 28/28]
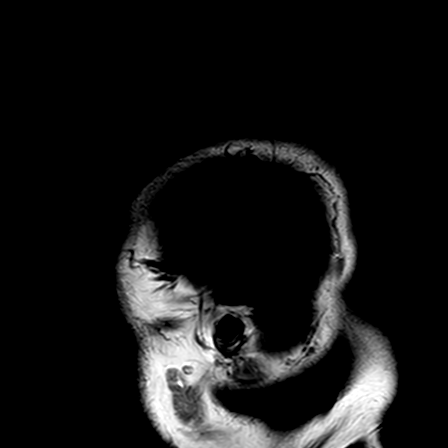

[Series 301: flair_ax · axial · 5.0mm · 0.49mm/px · z∈[-76,+80]mm · 2 of 27 slices shown]
[im 1/27]
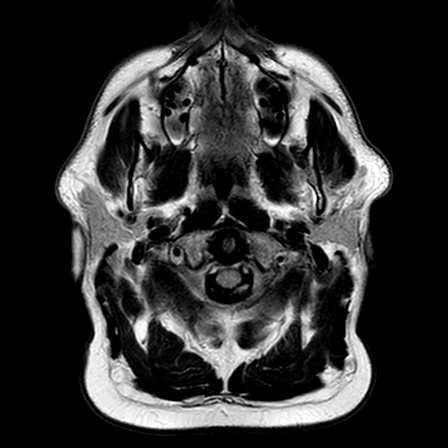
[im 27/27]
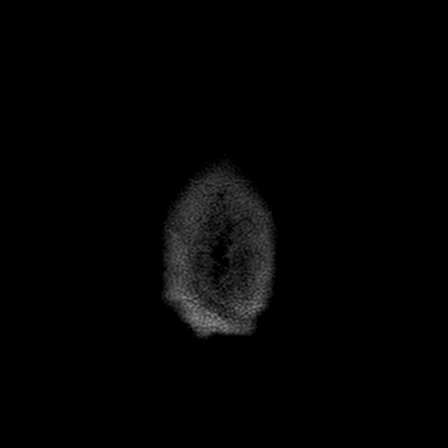

[Series 401: t1w_se_ax · axial · 5.0mm · 0.43mm/px · z∈[-76,+80]mm · 2 of 27 slices shown]
[im 1/27]
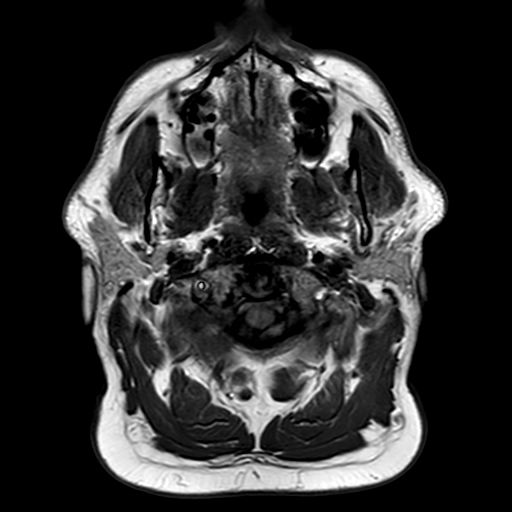
[im 27/27]
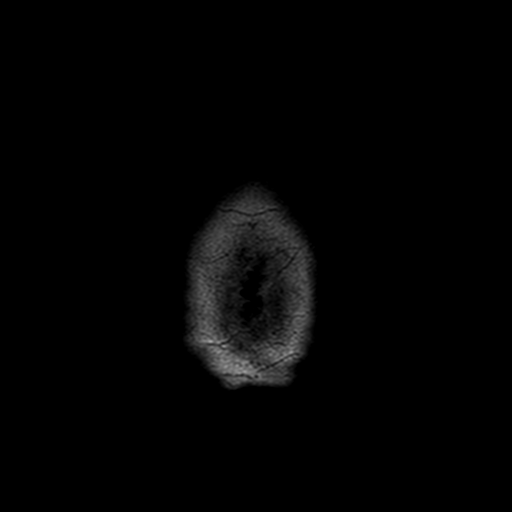

[Series 503: dadc map · axial · 5.0mm · 1.03mm/px · 1 of 27 slices shown]
[im 1/27]
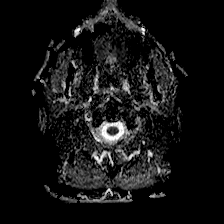

[Series 601: SWI · axial · 3.0mm · 0.40mm/px · z∈[-58,+81]mm · 10 of 200 slices shown]
[im 14/200]
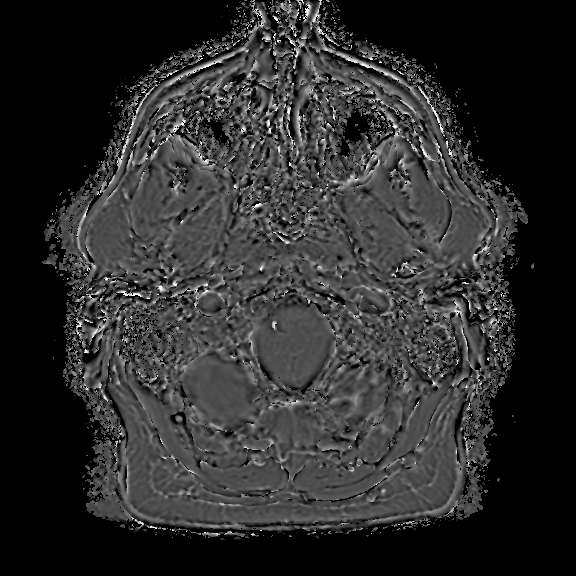
[im 27/200]
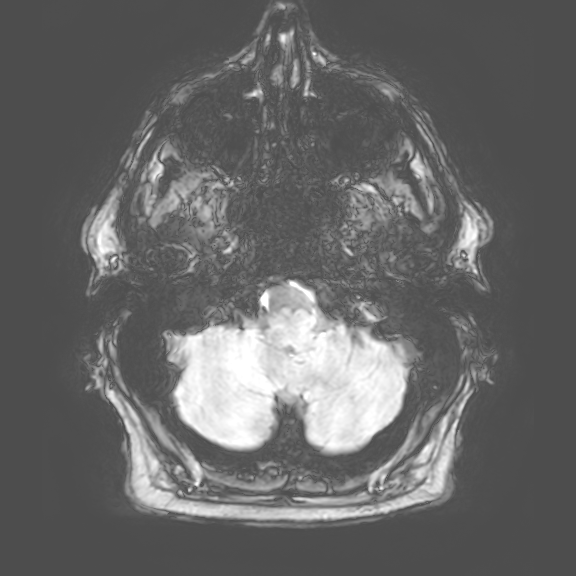
[im 40/200]
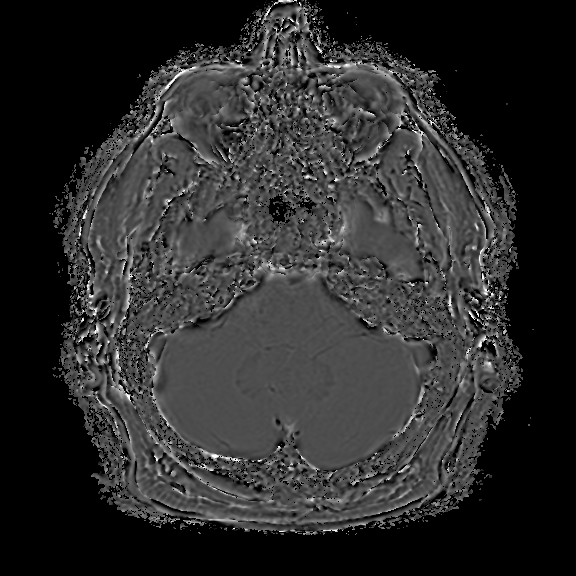
[im 67/200]
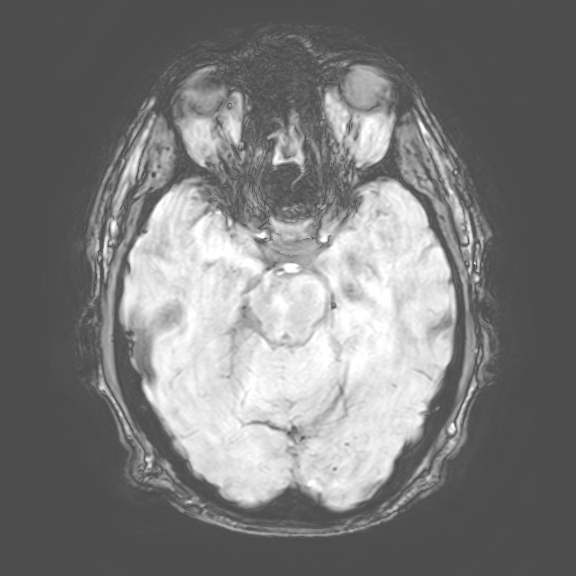
[im 93/200]
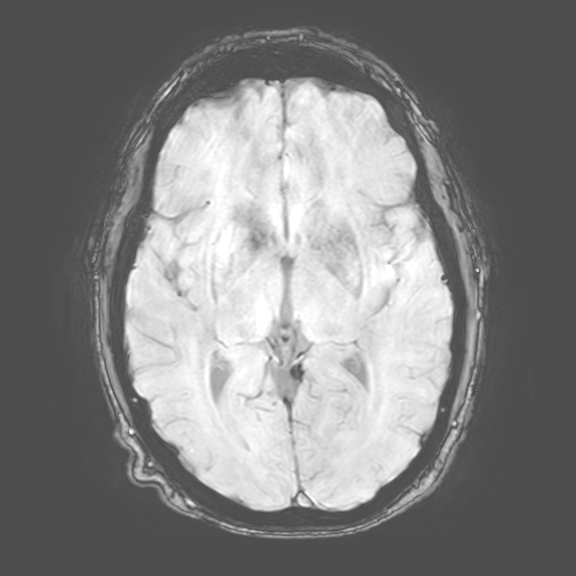
[im 107/200]
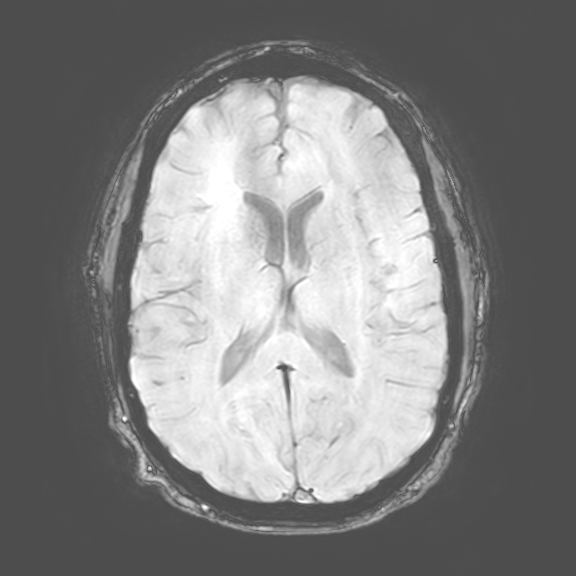
[im 120/200]
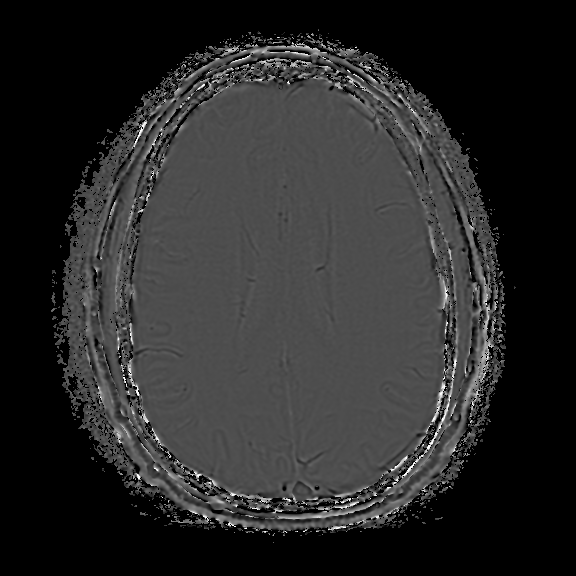
[im 146/200]
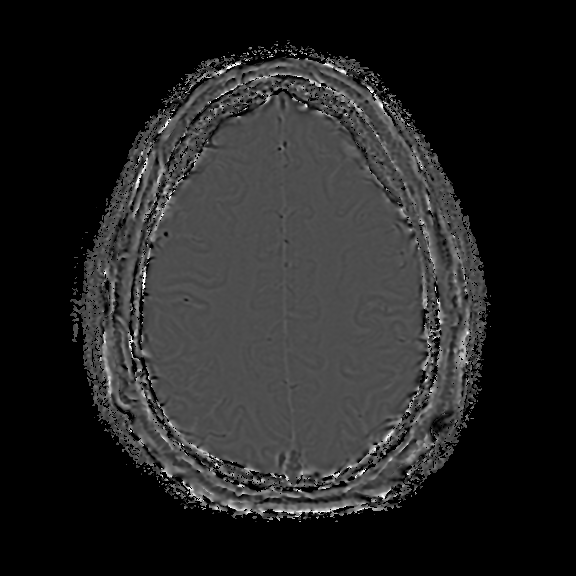
[im 173/200]
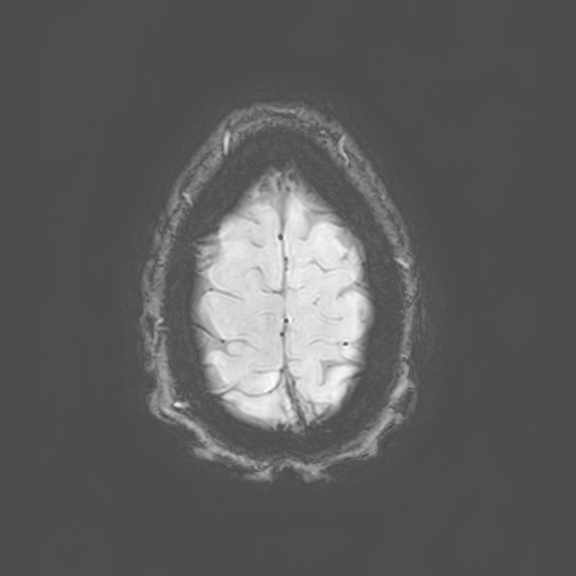
[im 200/200]
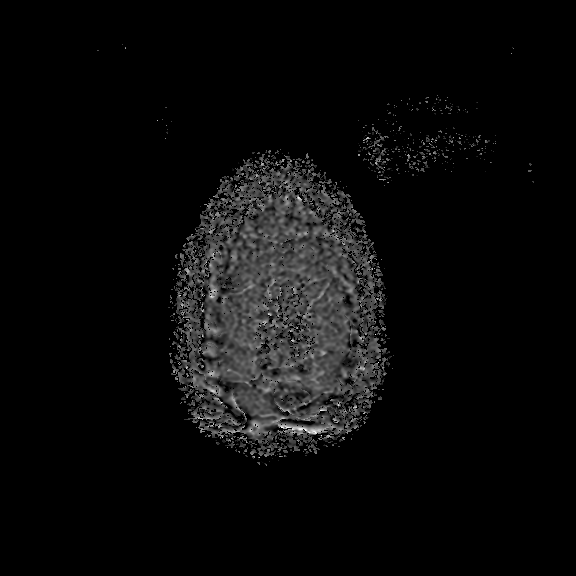

[19 of 48 positions shown; findings below may reference images not displayed]

FINDINGS: -------------------------------------------------------------------------------- 
---- 
ORBITS/FACE: Images of the orbit are mildly degraded by motion artifact. 
Imaging of the orbits demonstrates intact globes. Symmetrical extraocular 
musculature without thickening/enlargement. The intraorbital, canalicular and 
prechiasmatic optic nerve segments are normal in size and signal intensity 
bilaterally. The optic chiasm and proximal optic tracts are unremarkable. No 
localized inflammation of the intraconal or extraconal orbital fat. Normal 
lacrimal glands. No intraorbital mass or pathologic intraorbital enhancement. 
Facial soft tissues demonstrate no mass or adenopathy. 
-------------------------------------------------------------------------------- 
--- 
INTRACRANIAL:  
The cerebellar tonsils are minimally low lying, residing at the level the 
foramen magnum. No upper cervical cord syrinx. Brain parenchymal signal pattern 
is essentially normal for age with minimal periventricular and pontine T-2 FLAIR 
hyperintensity noted. Findings most likely related to mild chronic small vessel 
vascular change. No mass or abnormal extra-axial fluid collection. Normal brain 
parenchymal volume without hydrocephalus. 
No acute ischemia.  No abnormal foci of susceptibility artifact in the brain. 
Patency of the intracranial vascular flow voids.  No acute intracranial 
hemorrhage, mass effect, midline shift. No pathologic contrast enhancement. 
-------------------------------------------------------------------------------- 
-- 
OTHER: 
SINUSES/T-BONES:  Small left mastoid effusion. No middle ear or right mastoid 
effusion.  Mild right maxillary membrane thickening. Mild bifrontal membrane 
thickening. 
MARROW SIGNAL/SOFT TISSUES: No focal suspect signal abnormality. 
-------------------------------------------------------------------------------- 
-
IMPRESSION: Images of the orbits are mildly degraded by motion artifact, although no orbital 
pathology is identified. 
Minimally low lying cerebellar tonsils residing at the level the foramen magnum. 
No upper cervical cord syrinx is suspected. 
No acute intracranial process with mild presumed chronic appearing brain 
parenchymal changes detailed above. 
Small left mastoid effusion. 
Mild paranasal membrane thickening.

## 2021-10-13 ENCOUNTER — Other Ambulatory Visit: Payer: Self-pay | Admitting: Primary Care

## 2021-10-14 NOTE — Telephone Encounter (Signed)
Last Visit: 08/02/2021  Next Visit: 0  Labs done: 06/08/2019

## 2021-11-04 ENCOUNTER — Encounter: Payer: Self-pay | Admitting: Primary Care

## 2021-12-20 ENCOUNTER — Other Ambulatory Visit: Payer: Self-pay

## 2021-12-20 ENCOUNTER — Encounter: Payer: Self-pay | Admitting: Primary Care

## 2021-12-20 ENCOUNTER — Ambulatory Visit: Payer: PRIVATE HEALTH INSURANCE | Admitting: Primary Care

## 2021-12-20 VITALS — BP 110/80 | HR 67 | Ht 68.0 in | Wt 235.0 lb

## 2021-12-20 DIAGNOSIS — G4733 Obstructive sleep apnea (adult) (pediatric): Secondary | ICD-10-CM

## 2021-12-20 DIAGNOSIS — I10 Essential (primary) hypertension: Secondary | ICD-10-CM

## 2021-12-20 DIAGNOSIS — F988 Other specified behavioral and emotional disorders with onset usually occurring in childhood and adolescence: Secondary | ICD-10-CM

## 2021-12-24 NOTE — Progress Notes (Signed)
Patient presents for a preoperative evaluation.  He is having the lower lid blepharoplasty done down in [place].  He needs clearance from a prior to proceeding with surgery.  He believes is going to be under general anesthesia.  Does not sound like he met the criteria for medical/insurance coverage for the procedure so he is paying out-of-pocket.  He does feel like his lower field of vision is restricted slightly due to his lids.    He has had surgery done before and tolerated it well.  Does not recall any adverse effects from scrub/anesthetics etc.    He has no recent fever chills cough or cold symptoms.  He has had no issues with activity tolerance with no decreased activity tolerance or chest pain or palpitations.    His ongoing active medical issues include  1.  Hypertension-managed with hydrochlorothiazide 25 mg, amlodipine 2.5 mg and losartan 10 mg.  No lightheadedness nor dizziness.  He actually has dropped some weight and he seen his blood pressure fall from that.  2.  Sleep apnea-on CPAP feels well rested.  3.  Mood-on escitalopram at 20 mg.  He also takes Strattera for focus issues.  Right now he thinks these are managed well and feels there is no need to change.    Social history:  He may moved back up here.  His wife is still working and remained in the area so he may come back up here and stay longer until she is ready to fully retire and then they may move permanently down to [place].          MEDICAL PROBLEMS:  Patient Active Problem List   Diagnosis Code    Anxiety F41.9    Depression F32.A    ADD (attention deficit disorder) F98.8    Unspecified essential hypertension I10    Obesity E66.9    OSA (obstructive sleep apnea) G47.33    Erectile dysfunction N52.9    Other cervical disc degeneration at C6-C7 level M50.323        CURRENT MEDICATIONS:  Current Outpatient Medications   Medication Sig    fenofibrate (TRICOR) 48 mg tablet TAKE 1 TABLET BY MOUTH EVERY DAY    amLODIPine (NORVASC) 2.5 mg tablet  TAKE 1 TABLET BY MOUTH EVERY DAY    hydroCHLOROthiazide (HYDRODIURIL) 25 mg tablet TAKE 1 TABLET BY MOUTH EVERY DAY IN THE MORNING    escitalopram (LEXAPRO) 20 mg tablet Take 1 tablet (20 mg total) by mouth daily    atomoxetine (STRATTERA) 40 mg capsule TAKE 2 CAPSULES BY MOUTH DAILY    losartan (COZAAR) 50 mg tablet TAKE 1 TABLET BY MOUTH EVERY DAY    tadalafil (CIALIS) 10 MG tablet Take 1 tablet (10 mg total) by mouth daily as needed for Erectile Dysfunction  Take at least 30 minutes prior to sexual activity.    auto-titrating CPAP (AUTOSET) machine S10 AutoCPAP with min pres of 5 cm-max pres of 18 cm. Dispense with humidifier,heated tubing, & all required equipment. Duration: lifetime     No current facility-administered medications for this visit.        The patient's medications and allergies were reviewed, reconciled, updated as needed and changes were made.    ALLERGIES:  Allergies: No Known Allergies (drug, envir, food or latex)     EXAMINATION:  Vitals:    12/20/21 1603   BP: 110/80   Pulse: 67   SpO2: 96%   Weight: 106.6 kg (235 lb)   Height: 1.727 m (5\' 8" )  Body mass index is 35.73 kg/m.    Carotids are without bruits  Thyroid is nl, no nodules  Lungs are clear, no wheeze, no rales  Card exam shows a rrr, nl S1, S2, no murmurs  Ext show no edema. Good DP pulses.        ASSESSMENT/PLAN:  1.  Hypertension-blood pressure at goal.  No change to losartan, hydrochlorothiazide or amlodipine dose.  2.  Attention deficit disorder/anxiety-stable well managed with Lexapro 20 and Strattera 80 mg no changes recommended.  3.  Sleep apnea-continues with use of CPAP.  4.  Blepharoplasty-I see no contraindications to proceeding with surgery and his form was completed and will be faxed to the doctor in [place].

## 2021-12-27 ENCOUNTER — Encounter: Payer: Self-pay | Admitting: Primary Care

## 2022-01-01 ENCOUNTER — Encounter: Payer: Self-pay | Admitting: Primary Care

## 2022-01-01 NOTE — Telephone Encounter (Signed)
Printed and faxed

## 2022-01-08 ENCOUNTER — Encounter: Payer: Self-pay | Admitting: Gastroenterology

## 2022-01-13 ENCOUNTER — Other Ambulatory Visit: Payer: Self-pay | Admitting: Primary Care

## 2022-01-13 NOTE — Telephone Encounter (Signed)
Last seen 12-20-21, sched 03-07-22  Labs 06-08-19, has future labs

## 2022-02-02 ENCOUNTER — Other Ambulatory Visit: Payer: Self-pay | Admitting: Primary Care

## 2022-02-03 MED ORDER — TADALAFIL 10 MG PO TABS *I*
10.0000 mg | ORAL_TABLET | Freq: Every day | ORAL | 5 refills | Status: DC | PRN
Start: 2022-02-03 — End: 2022-11-01

## 2022-02-03 NOTE — Telephone Encounter (Signed)
Last office visit:   12/20/2021  Last telemedicine visit:  Visit date not found  Last PA office visit:  Visit date not found  Patients upcoming appointments:  Future Appointments   Date Time Provider Department Center   03/07/2022  9:30 AM Mellody Memos, MD [facility] None     Recent Lab results:  GENERAL CHEMISTRY   No value within the past 365 days   LIPID PROFILE   No value within the past 365 days   LIVER PROFILE   No value within the past 365 days   DIABETES THYROID   No value within the past 365 days No value within the past 365 days      Pending/Orders Labs:  Lab Frequency Next Occurrence   Lipid Panel (Reflex to Direct  LDL if Triglycerides more than 400) Once 08/02/2021   Basic metabolic panel Once 08/02/2021   PSA (eff.06-2008) Once 08/02/2021   CBC and differential Once 08/02/2021

## 2022-02-10 ENCOUNTER — Other Ambulatory Visit: Payer: Self-pay | Admitting: Primary Care

## 2022-02-11 NOTE — Telephone Encounter (Signed)
Last seen 12-20-21, sched 03-07-22  Labs 06-08-19, has future labs

## 2022-02-15 ENCOUNTER — Other Ambulatory Visit: Payer: Self-pay | Admitting: Primary Care

## 2022-02-17 NOTE — Telephone Encounter (Signed)
NOV cancelled. No future scheduled  LOV 12/20/21  Labs future ordered, last done 06/08/19

## 2022-03-07 ENCOUNTER — Ambulatory Visit: Payer: PRIVATE HEALTH INSURANCE | Admitting: Primary Care

## 2022-06-30 ENCOUNTER — Other Ambulatory Visit: Payer: Self-pay | Admitting: Primary Care

## 2022-06-30 NOTE — Telephone Encounter (Signed)
NOV Not scheduled  LOV 12/20/21  Labs 06/08/19

## 2022-07-01 MED ORDER — LOSARTAN POTASSIUM 50 MG PO TABS *I*
50.0000 mg | ORAL_TABLET | Freq: Every day | ORAL | 3 refills | Status: DC
Start: 2022-07-01 — End: 2022-12-31

## 2022-08-12 ENCOUNTER — Other Ambulatory Visit: Payer: Self-pay | Admitting: Primary Care

## 2022-11-01 ENCOUNTER — Other Ambulatory Visit: Payer: Self-pay | Admitting: Primary Care

## 2022-11-03 MED ORDER — TADALAFIL 10 MG PO TABS *I*
10.0000 mg | ORAL_TABLET | Freq: Every day | ORAL | 5 refills | Status: AC | PRN
Start: 2022-11-03 — End: ?

## 2022-11-03 NOTE — Telephone Encounter (Signed)
Last seen 12-20-21, no appt  Labs 06-08-19

## 2022-11-11 ENCOUNTER — Other Ambulatory Visit: Payer: Self-pay | Admitting: Primary Care

## 2022-11-11 NOTE — Telephone Encounter (Signed)
LOV 12/20/21  NOV no upcoming appt.  Labs 06/08/19    Last refill: 08/13/2022

## 2022-11-12 ENCOUNTER — Other Ambulatory Visit: Payer: Self-pay | Admitting: Primary Care

## 2022-11-12 NOTE — Telephone Encounter (Signed)
Last seen 12-20-21, no appt  Labs 06-08-19, I stop checked

## 2022-12-31 ENCOUNTER — Other Ambulatory Visit: Payer: Self-pay | Admitting: Primary Care

## 2022-12-31 MED ORDER — LOSARTAN POTASSIUM 50 MG PO TABS *I*
50.0000 mg | ORAL_TABLET | Freq: Every day | ORAL | 3 refills | Status: AC
Start: 2022-12-31 — End: ?

## 2022-12-31 NOTE — Telephone Encounter (Signed)
Last seen 12-20-21, no appt  Labs 06-08-19

## 2023-02-10 ENCOUNTER — Other Ambulatory Visit: Payer: Self-pay | Admitting: Primary Care

## 2023-02-10 NOTE — Telephone Encounter (Signed)
Last seen 12-20-21, sched 03-07-22  Labs 06-08-19, no future labs

## 2023-08-09 ENCOUNTER — Other Ambulatory Visit: Payer: Self-pay | Admitting: Primary Care

## 2023-08-11 NOTE — Telephone Encounter (Signed)
 LOV 12/20/21  NOV no upcoming appt.  Labs 06/08/19    Last ordered: 6 months ago (02/10/2023

## 2023-11-07 ENCOUNTER — Other Ambulatory Visit: Payer: Self-pay | Admitting: Primary Care

## 2023-11-09 NOTE — Telephone Encounter (Signed)
 LOV 10/6/23NOV no upcoming appt.Labs 3/24/21Last ordered: 12 months ago (11/12/2022

## 2024-02-07 ENCOUNTER — Other Ambulatory Visit: Payer: Self-pay | Admitting: Primary Care

## 2024-02-08 ENCOUNTER — Other Ambulatory Visit: Payer: Self-pay | Admitting: Primary Care

## 2024-02-08 NOTE — Telephone Encounter (Signed)
 Needs apt, last seen in 2023

## 2024-02-08 NOTE — Telephone Encounter (Signed)
 Last office visit: 10/6/2023Last telehome visit: Visit date not foundPatients upcoming appointments:No future appointments.BP Readings from Last 3 Encounters: 12/20/21 110/80 08/02/21 134/88 05/04/20 130/88  Recent Lab results:GENERAL CHEMISTRY No value within the past 365 days LIPID PROFILE No value within the past 365 days LIVER PROFILE No value within the past 365 days DIABETES THYROID No value within the past 365 days No value within the past 365 days  Pending/Orders Labs:Lab Frequency Next Occurrence

## 2024-02-09 NOTE — Telephone Encounter (Signed)
 Left message to call office back

## 7138-11-16 DEATH — deceased
